# Patient Record
Sex: Female | Born: 1985 | Race: White | Hispanic: No | Marital: Married | Smoking: Never smoker
Health system: Southern US, Community
[De-identification: ages and names within clinical notes are randomized; demographics above are authoritative.]

## PROBLEM LIST (undated history)

## (undated) DIAGNOSIS — A630 Anogenital (venereal) warts: Secondary | ICD-10-CM

## (undated) DIAGNOSIS — D696 Thrombocytopenia, unspecified: Secondary | ICD-10-CM

## (undated) DIAGNOSIS — R87619 Unspecified abnormal cytological findings in specimens from cervix uteri: Secondary | ICD-10-CM

## (undated) DIAGNOSIS — IMO0002 Reserved for concepts with insufficient information to code with codable children: Secondary | ICD-10-CM

## (undated) DIAGNOSIS — O99119 Other diseases of the blood and blood-forming organs and certain disorders involving the immune mechanism complicating pregnancy, unspecified trimester: Secondary | ICD-10-CM

## (undated) DIAGNOSIS — Z8619 Personal history of other infectious and parasitic diseases: Secondary | ICD-10-CM

## (undated) DIAGNOSIS — R87629 Unspecified abnormal cytological findings in specimens from vagina: Secondary | ICD-10-CM

## (undated) DIAGNOSIS — O4190X0 Disorder of amniotic fluid and membranes, unspecified, unspecified trimester, not applicable or unspecified: Secondary | ICD-10-CM

## (undated) HISTORY — DX: Personal history of other infectious and parasitic diseases: Z86.19

## (undated) HISTORY — PX: WISDOM TOOTH EXTRACTION: SHX21

## (undated) HISTORY — DX: Anogenital (venereal) warts: A63.0

## (undated) HISTORY — DX: Unspecified abnormal cytological findings in specimens from vagina: R87.629

## (undated) HISTORY — DX: Disorder of amniotic fluid and membranes, unspecified, unspecified trimester, not applicable or unspecified: O41.90X0

## (undated) HISTORY — DX: Reserved for concepts with insufficient information to code with codable children: IMO0002

## (undated) HISTORY — PX: NO PAST SURGERIES: SHX2092

## (undated) HISTORY — DX: Unspecified abnormal cytological findings in specimens from cervix uteri: R87.619

---

## 2009-04-04 ENCOUNTER — Emergency Department (HOSPITAL_COMMUNITY): Admission: EM | Admit: 2009-04-04 | Discharge: 2009-04-04 | Payer: Self-pay | Admitting: Emergency Medicine

## 2009-04-13 ENCOUNTER — Encounter: Payer: Self-pay | Admitting: Cardiovascular Disease

## 2009-04-20 DIAGNOSIS — R519 Headache, unspecified: Secondary | ICD-10-CM | POA: Insufficient documentation

## 2009-04-20 DIAGNOSIS — IMO0002 Reserved for concepts with insufficient information to code with codable children: Secondary | ICD-10-CM | POA: Insufficient documentation

## 2009-04-20 DIAGNOSIS — R109 Unspecified abdominal pain: Secondary | ICD-10-CM

## 2009-04-20 DIAGNOSIS — R42 Dizziness and giddiness: Secondary | ICD-10-CM

## 2009-04-20 DIAGNOSIS — R55 Syncope and collapse: Secondary | ICD-10-CM

## 2009-04-20 DIAGNOSIS — R51 Headache: Secondary | ICD-10-CM

## 2009-04-20 DIAGNOSIS — B977 Papillomavirus as the cause of diseases classified elsewhere: Secondary | ICD-10-CM

## 2009-04-20 HISTORY — DX: Reserved for concepts with insufficient information to code with codable children: IMO0002

## 2009-04-26 ENCOUNTER — Ambulatory Visit: Payer: Self-pay | Admitting: Cardiovascular Disease

## 2009-05-25 ENCOUNTER — Encounter: Payer: Self-pay | Admitting: Cardiovascular Disease

## 2009-05-25 ENCOUNTER — Ambulatory Visit: Payer: Self-pay

## 2009-05-25 ENCOUNTER — Ambulatory Visit (HOSPITAL_COMMUNITY): Admission: RE | Admit: 2009-05-25 | Discharge: 2009-05-25 | Payer: Self-pay | Admitting: Obstetrics

## 2009-05-25 ENCOUNTER — Ambulatory Visit: Payer: Self-pay | Admitting: Cardiology

## 2009-05-30 ENCOUNTER — Ambulatory Visit: Payer: Self-pay | Admitting: Cardiology

## 2009-07-14 ENCOUNTER — Encounter: Admission: RE | Admit: 2009-07-14 | Discharge: 2009-07-14 | Payer: Self-pay | Admitting: Internal Medicine

## 2010-06-20 NOTE — Assessment & Plan Note (Signed)
Summary: 1 month rov/sl   Visit Type:  1 MO F/U Primary Provider:  Dr. Merla Riches  CC:  no cardiac complaints today.  History of Present Illness: 25 yo WF with no significant past medical seen one month ago  for further evaluation of syncope. She told  me that she has passed out about once per year over last twenty years. Many of these episodes occur after prolonged standing but mostly after blood draws and procedures. Most recently she had a colposcopy  and she felt dizzy afterwards and passed out while laying down. She was reported to have rigidity of her arms but no clear seizure activity. She has had heavy menses in the past and had recent mild anemia. Orthostatics at the initial visit were normal. She described no chest pain or palpitations. We performed an echocardiogram which was normal. She is here today to review the results.  Current Medications (verified): 1)  No Meds At This Time  Allergies: 1)  ! Ceclor  Past History:  Past Medical History: Reviewed history from 04/20/2009 and no changes required. 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 Tobacco Use - No.  No illicit drugs Social alcohol Married, no children Administrative assistant-part time  Review of Systems  The patient denies fatigue, malaise, fever, weight gain/loss, vision loss, decreased hearing, hoarseness, chest pain, palpitations, shortness of breath, prolonged cough, wheezing, sleep apnea, coughing up blood, abdominal pain, blood in stool, nausea, vomiting, diarrhea, heartburn, incontinence, blood in urine, muscle weakness, joint pain, leg swelling, rash, skin lesions, headache, fainting, dizziness, depression, anxiety, enlarged lymph nodes, easy bruising or bleeding, and environmental allergies.    Vital Signs:  Patient profile:   25  year old female Height:      63 inches Weight:      127 pounds BMI:     22.58 Pulse rate:   64 / minute Pulse rhythm:   regular BP sitting:   102 / 70  (left arm) Cuff size:   large  Vitals Entered By: Danielle Rankin, CMA (May 30, 2009 8:55 AM)  Physical Exam  General:  General: Well developed, well nourished, NAD Psychiatric: Mood and affect normal Neck: No JVD Lungs:Clear bilaterally, no wheezes, rhonci, crackles CV: RRR no murmurs, gallops rubs Abdomen: soft, NT, ND, BS present Extremities: No edema, pulses 2+.    Echocardiogram  Procedure date:  05/25/2009  Findings:      - Left ventricle: The cavity size was normal. Wall thickness was       normal. Systolic function was normal. The estimated ejection       fraction was in the range of 60% to 65%. Wall motion was normal;       there were no regional wall motion abnormalities. Left ventricular       diastolic function parameters were normal.     - Aortic valve: There was no stenosis.     - Mitral valve: No significant regurgitation.     - Common pulmonary vein: The Doppler velocity and flow profile were       normal.     - Right ventricle: The cavity size was normal. Systolic function was       normal.     - Pulmonary arteries: No complete TR doppler jet was measured so       unable to estimate PA systolic pressure.     - Systemic  veins: IVC measured 2.2 cm with normal respirophasic       variation. Estimated RA pressure 6-10 mmHg.     Impressions:            - No cause for syncope noted on this study.  Impression & Recommendations:  Problem # 1:  SYNCOPE (ICD-780.2) No clear etiology. Most likely vasovagal. No cardiac abnormalities on echo. No suggestion of arrhythmia. No further workup at this time. She is encouraged to drink adequate fluids and continue with daily exercise.   Patient Instructions: 1)  Your physician recommends that you schedule a follow-up appointment as needed

## 2010-08-23 LAB — POCT I-STAT, CHEM 8
Calcium, Ion: 1.23 mmol/L (ref 1.12–1.32)
Creatinine, Ser: 0.8 mg/dL (ref 0.4–1.2)
Glucose, Bld: 109 mg/dL — ABNORMAL HIGH (ref 70–99)
Potassium: 4.4 mEq/L (ref 3.5–5.1)
Sodium: 136 mEq/L (ref 135–145)

## 2010-08-23 LAB — DIFFERENTIAL
Basophils Absolute: 0 10*3/uL (ref 0.0–0.1)
Basophils Relative: 0 % (ref 0–1)
Eosinophils Absolute: 0 10*3/uL (ref 0.0–0.7)
Neutro Abs: 7.5 10*3/uL (ref 1.7–7.7)
Neutrophils Relative %: 83 % — ABNORMAL HIGH (ref 43–77)

## 2010-08-23 LAB — CBC
Hemoglobin: 11.9 g/dL — ABNORMAL LOW (ref 12.0–15.0)
MCHC: 33.7 g/dL (ref 30.0–36.0)
RBC: 4.16 MIL/uL (ref 3.87–5.11)
WBC: 9 10*3/uL (ref 4.0–10.5)

## 2010-08-23 LAB — URINALYSIS, ROUTINE W REFLEX MICROSCOPIC
Hgb urine dipstick: NEGATIVE
Nitrite: NEGATIVE
Specific Gravity, Urine: 1.015 (ref 1.005–1.030)
pH: 7.5 (ref 5.0–8.0)

## 2010-08-23 LAB — GLUCOSE, CAPILLARY

## 2011-05-22 NOTE — L&D Delivery Note (Signed)
Delivery Note  Small ant lip/+1 at 0145 in tub, spont. bearing down FHR 150 w/ variables to 100. Assisted OOT to bed for lateral pos and continuous EFM  Pushed past ant. lip  In lat pos.  C/C/+2 at 0201 FHR cat II, O2 via FM   At 2:52 AM a viable female was delivered via Vaginal, Spontaneous Delivery (Presentation: Left Occiput Anterior). Spont vigorous cry at birth; APGAR: 9, 9; weight pending.   Placenta status: Intact, Spontaneous.  Cord:  with the following complications: tight CAN x 1, around body x 2, around arm x 1. Unable to reduce, baby delivered head flexed to R leg and body following through; Cord pH: not collected.  Anesthesia: Local  Episiotomy: None Lacerations: 2nd degree;Perineal;Labial Suture Repair: 2.0 3.0 vicryl rapide Est. Blood Loss (mL): 500cc  Uterine atony after placenta, resolved w/ vigorous massage, clots removed from lower uterine segment. Pitocin IM 10 units and IV 20 units. Cytotec 800 mcg per rectum. Will continue Pitocin IV over several hours.  Unasyn 3 gm x 1 prophylaxis for uterine exploration.  Fundus of uterus appears heart-shaped w/ 2 distinctly felt cornua.  Mom to postpartum.  Baby to mother for skin-to-skin, latched on breast in labor room.Marland Kitchen  PAUL,DANIELA 01/04/2012, 4:01 AM

## 2011-05-23 LAB — OB RESULTS CONSOLE ABO/RH

## 2011-05-23 LAB — OB RESULTS CONSOLE ANTIBODY SCREEN: Antibody Screen: NEGATIVE

## 2011-05-23 LAB — OB RESULTS CONSOLE HEPATITIS B SURFACE ANTIGEN: Hepatitis B Surface Ag: NEGATIVE

## 2011-05-23 LAB — OB RESULTS CONSOLE HIV ANTIBODY (ROUTINE TESTING): HIV: NONREACTIVE

## 2011-05-23 LAB — OB RESULTS CONSOLE RUBELLA ANTIBODY, IGM: Rubella: IMMUNE

## 2011-12-04 ENCOUNTER — Ambulatory Visit (INDEPENDENT_AMBULATORY_CARE_PROVIDER_SITE_OTHER): Payer: BC Managed Care – PPO | Admitting: Pediatrics

## 2011-12-04 DIAGNOSIS — Z7681 Expectant parent(s) prebirth pediatrician visit: Secondary | ICD-10-CM

## 2011-12-14 ENCOUNTER — Encounter (HOSPITAL_COMMUNITY): Payer: Self-pay | Admitting: *Deleted

## 2011-12-14 ENCOUNTER — Telehealth (HOSPITAL_COMMUNITY): Payer: Self-pay | Admitting: *Deleted

## 2011-12-14 NOTE — Telephone Encounter (Signed)
Preadmission screen  

## 2011-12-17 ENCOUNTER — Encounter (HOSPITAL_COMMUNITY): Payer: Self-pay | Admitting: *Deleted

## 2011-12-17 ENCOUNTER — Telehealth (HOSPITAL_COMMUNITY): Payer: Self-pay | Admitting: *Deleted

## 2011-12-17 NOTE — Telephone Encounter (Signed)
Preadmission screen  

## 2011-12-25 ENCOUNTER — Inpatient Hospital Stay (HOSPITAL_COMMUNITY): Admission: RE | Admit: 2011-12-25 | Payer: BC Managed Care – PPO | Source: Ambulatory Visit

## 2011-12-27 ENCOUNTER — Telehealth (HOSPITAL_COMMUNITY): Payer: Self-pay | Admitting: *Deleted

## 2011-12-27 NOTE — Telephone Encounter (Signed)
Preadmission screen  

## 2011-12-29 ENCOUNTER — Inpatient Hospital Stay (HOSPITAL_COMMUNITY): Admission: RE | Admit: 2011-12-29 | Payer: BC Managed Care – PPO | Source: Ambulatory Visit

## 2012-01-03 ENCOUNTER — Inpatient Hospital Stay (HOSPITAL_COMMUNITY)
Admission: AD | Admit: 2012-01-03 | Discharge: 2012-01-06 | DRG: 372 | Disposition: A | Discharge status: Home / Self Care | Payer: BC Managed Care – PPO | Source: Ambulatory Visit | Attending: Obstetrics and Gynecology | Admitting: Obstetrics and Gynecology

## 2012-01-03 ENCOUNTER — Encounter (HOSPITAL_COMMUNITY): Payer: Self-pay | Admitting: *Deleted

## 2012-01-03 ENCOUNTER — Inpatient Hospital Stay (HOSPITAL_COMMUNITY)
Admission: RE | Admit: 2012-01-03 | Payer: BC Managed Care – PPO | Source: Ambulatory Visit | Admitting: Obstetrics & Gynecology

## 2012-01-03 DIAGNOSIS — O409XX0 Polyhydramnios, unspecified trimester, not applicable or unspecified: Secondary | ICD-10-CM | POA: Diagnosis present

## 2012-01-03 DIAGNOSIS — D689 Coagulation defect, unspecified: Secondary | ICD-10-CM | POA: Diagnosis present

## 2012-01-03 DIAGNOSIS — D696 Thrombocytopenia, unspecified: Secondary | ICD-10-CM | POA: Diagnosis present

## 2012-01-03 DIAGNOSIS — O480 Post-term pregnancy: Principal | ICD-10-CM | POA: Diagnosis present

## 2012-01-03 HISTORY — DX: Other diseases of the blood and blood-forming organs and certain disorders involving the immune mechanism complicating pregnancy, unspecified trimester: O99.119

## 2012-01-03 HISTORY — DX: Thrombocytopenia, unspecified: D69.6

## 2012-01-03 LAB — CBC
MCH: 30.4 pg (ref 26.0–34.0)
MCHC: 33.4 g/dL (ref 30.0–36.0)
Platelets: 120 10*3/uL — ABNORMAL LOW (ref 150–400)
RDW: 13.5 % (ref 11.5–15.5)

## 2012-01-03 MED ORDER — OXYTOCIN BOLUS FROM INFUSION
250.0000 mL | Freq: Once | INTRAVENOUS | Status: AC
  Administered 2012-01-04: 250 mL via INTRAVENOUS
  Filled 2012-01-03: qty 500

## 2012-01-03 MED ORDER — LACTATED RINGERS IV SOLN: 500.0000 mL | INTRAVENOUS | Status: DC | PRN

## 2012-01-03 MED ORDER — FLEET ENEMA 7-19 GM/118ML RE ENEM: 1.0000 | ENEMA | RECTAL | Status: DC | PRN

## 2012-01-03 MED ORDER — ONDANSETRON HCL 4 MG/2ML IJ SOLN: 4.0000 mg | Freq: Four times a day (QID) | INTRAMUSCULAR | Status: DC | PRN

## 2012-01-03 MED ORDER — IBUPROFEN 600 MG PO TABS
600.0000 mg | ORAL_TABLET | Freq: Four times a day (QID) | ORAL | Status: DC | PRN
  Administered 2012-01-04: 600 mg via ORAL
  Filled 2012-01-03: qty 1

## 2012-01-03 MED ORDER — ACETAMINOPHEN 325 MG PO TABS: 650.0000 mg | ORAL_TABLET | ORAL | Status: DC | PRN

## 2012-01-03 MED ORDER — OXYCODONE-ACETAMINOPHEN 5-325 MG PO TABS
1.0000 | ORAL_TABLET | ORAL | Status: DC | PRN
  Administered 2012-01-04: 1 via ORAL
  Filled 2012-01-03: qty 1

## 2012-01-03 MED ORDER — CITRIC ACID-SODIUM CITRATE 334-500 MG/5ML PO SOLN: 30.0000 mL | ORAL | Status: DC | PRN

## 2012-01-03 MED ORDER — OXYTOCIN 40 UNITS IN LACTATED RINGERS INFUSION - SIMPLE MED
62.5000 mL/h | Freq: Once | INTRAVENOUS | Status: AC
  Administered 2012-01-04: 62.5 mL/h via INTRAVENOUS
  Filled 2012-01-03: qty 1000

## 2012-01-03 MED ORDER — PROMETHAZINE HCL 25 MG/ML IJ SOLN
25.0000 mg | Freq: Four times a day (QID) | INTRAMUSCULAR | Status: DC | PRN
  Administered 2012-01-03: 25 mg via INTRAMUSCULAR
  Filled 2012-01-03: qty 1

## 2012-01-03 MED ORDER — NALBUPHINE SYRINGE 5 MG/0.5 ML
10.0000 mg | INJECTION | INTRAMUSCULAR | Status: DC | PRN
  Administered 2012-01-03: 10 mg via INTRAMUSCULAR
  Filled 2012-01-03 (×2): qty 0.5
  Filled 2012-01-03: qty 1

## 2012-01-03 MED ORDER — LIDOCAINE HCL (PF) 1 % IJ SOLN
30.0000 mL | INTRAMUSCULAR | Status: DC | PRN
  Administered 2012-01-04: 30 mL via SUBCUTANEOUS
  Filled 2012-01-03: qty 30

## 2012-01-03 NOTE — Progress Notes (Signed)
2-31min apart per observation/per pt.

## 2012-01-03 NOTE — Progress Notes (Signed)
Monitors off per Paul,CNM orders.

## 2012-01-03 NOTE — H&P (Signed)
Kathryn Ferguson is a 26 y.o. female presenting for AROM IOL. [facility]NC at [facility] since 8 wks, [place], [place]  Maternal Medical History:  Reason for admission: Reason for Admission:   nauseaAdmit for IOL, advanced dilation and post dates, NST equivocal / BPP 8/8 in office today.   Fetal activity: Perceived fetal activity is normal.   Last perceived fetal movement was within the past hour.    Prenatal complications: Poly diagnosed at 28 wks, AFI max 27cm at 38 wks, decreased to 15.5 today.  Prenatal Complications - Diabetes: none. Failed 1GTT, passed 3GTT with one abnormal value. F/U random fastings WNL.  OB History    Grav Para Term Preterm Abortions TAB SAB Ect Mult Living   1              Past Medical History  Diagnosis Date  . Abnormal Pap smear   . HPV (human papilloma virus) anogenital infection   . Other specified fetal and placental problems affecting management of mother, unspecified as to episode of care     sch  . H/O varicella   . Nonspecific abnormal finding in amniotic fluid     polyhydramnios  . Normal labor 01/03/2012   Past Surgical History  Procedure Date  . No past surgeries    Family History: family history includes Cancer in her paternal grandfather; Heart attack in her maternal aunt; Heart disease in her maternal grandfather and maternal grandmother; Hemophilia in her paternal grandfather; Nephrolithiasis in her maternal grandfather and mother; and Thyroid disease in her mother. Social History:  reports that she has never smoked. She has never used smokeless tobacco. She reports that she does not drink alcohol or use illicit drugs.   Prenatal Transfer Tool  Maternal Diabetes: No Genetic Screening: Declined Maternal Ultrasounds/Referrals: Normal Fetal Ultrasounds or other Referrals:  None Maternal Substance Abuse:  No Significant Maternal Medications:  None Significant Maternal Lab Results:  Lab values include: Group B Strep negative Other Comments:   None  Review of Systems  Gastrointestinal: Negative for heartburn and nausea.  Neurological: Negative for headaches.  All other systems reviewed and are negative.    Dilation: 4 Effacement (%): 80 Station: -1 Exam by:: Daniella Paul,CNM AROM with clear AF, moderate amount, vertex well engaged  Blood pressure 125/81, pulse 76, temperature 98.4 F (36.9 C), temperature source Oral, resp. rate 20, last menstrual period 03/11/2011. Maternal Exam:  Uterine Assessment: Contraction strength is mild.  Contraction frequency is regular.  q8-10 min  Abdomen: Patient reports no abdominal tenderness. Fundal height is 42cm.   Estimated fetal weight is 8'2 at 40 wks.   Fetal presentation: vertex  Introitus: Normal vulva. Normal vagina.  Pelvis: adequate for delivery.   Cervix: Cervix evaluated by digital exam.     Fetal Exam Fetal Monitor Review: Mode: ultrasound.   Baseline rate: 140.  Variability: moderate (6-25 bpm).   Pattern: accelerations present and no decelerations.    Fetal State Assessment: Category I - tracings are normal.     Physical Exam  Constitutional: She is oriented to person, place, and time. She appears well-developed and well-nourished. No distress.  HENT:  Head: Normocephalic.  Neck: Normal range of motion.  Cardiovascular: Normal rate and regular rhythm.   Respiratory: Effort normal and breath sounds normal.  GI: Soft. Bowel sounds are normal.  Genitourinary: Vagina normal.  Musculoskeletal: Normal range of motion. She exhibits no edema.  Neurological: She is alert and oriented to person, place, and time. She has normal reflexes.  Skin: Skin is warm and dry.  Psychiatric: She has a normal mood and affect.    Prenatal labs: ABO, Rh: O/Positive/-- (01/02 0000) Antibody: Negative (01/02 0000) Rubella: Immune (01/02 0000) RPR: Nonreactive (01/02 0000)  HBsAg: Negative (01/02 0000)  HIV: Non-reactive (01/02 0000)  GBS: Negative (07/05 0000)  Genetic  screen declined [place]: normal female anatomy, posterior placenta  Assessment/Plan: IUP at [redacted]w[redacted]d, LGA for AROM IOL Hx poly, normalized Desires water labor/birth, un medicated birth FHT reassuring, Cat I  Admit to BS, routine orders AROM done, plan Pitocin PRN if no active labor next 4-6 hours CNM care, labor support in active labor. Water labor / attempt at water birth cautiously given LGA fetus.  Consult Dr. Alesia Banda 01/03/2012, 4:10 PM

## 2012-01-03 NOTE — Progress Notes (Signed)
S: Requesting exam, ctx getting stronger, difficulty relaxing in between.  O: Filed Vitals:   01/03/12 1435 01/03/12 1552 01/03/12 2006 01/03/12 2137  BP: 124/76 125/81 125/72   Pulse: 75 76 77   Temp:  98.4 F (36.9 C) 99.1 F (37.3 C) 98.3 F (36.8 C)  TempSrc:  Oral Oral Oral  Resp: 20 20 20       FHT:  FHR: 135 bpm, variability: minimal/moderate,  accelerations:  Present,  decelerations:  Absent UC:   irregular, every 1.5-3 minutes SVE:   Dilation: 6 Effacement (%): 100 Station: 0 Exam by:: D. Miciah Covelli CNM   A / P: Spontaneous labor, progressing normally  Fetal Wellbeing:  Category I Pain Control:  Desires pain meds, will give Nubain/Phenergan  Plan hydrotherapy after rest 1-2 hours  Anticipated MOD:  NSVD  Kathryn Ferguson 01/03/2012, 10:05 PM

## 2012-01-03 NOTE — Progress Notes (Signed)
Ysidra Sopher is a 26 y.o. G1P0000 at [redacted]w[redacted]d by ultrasound admitted for induction of labor due to Post dates. Due date 12/24/2011.  Subjective: Feeling ctx for past hour, variable intensity, coping well w/ spouse coaching. On birthing ball then bed in lateral position. Oral hydration. Clear AF seen occasionally.  Objective: Filed Vitals:   01/03/12 1435 01/03/12 1552  BP: 124/76 125/81  Pulse: 75 76  Temp:  98.4 F (36.9 C)  TempSrc:  Oral  Resp: 20 20          FHT:  FHR: 135 bpm, variability: moderate,  accelerations:  Present,  decelerations:  Absent UC:   Palp q 2-4 min, mild/mod SVE:   deferred   Labs:   Basename 01/03/12 1615  WBC 9.0  HGB 11.9*  HCT 35.6*  PLT 120*    Assessment / Plan: Induction of labor due to post dates and advanced dilation,  progressing well on pitocin Mild thrombocytopenia (120 today, was 171 at 28 wks), BP stable  Labor: Progressing normally Preeclampsia:  n/a Fetal Wellbeing:  Category I Pain Control:  Labor support without medications I/D:  n/a Anticipated MOD:  NSVD  Expectant management, continue resting on side lying pos between ctx to conserve energy, position changes encouraged q 30-60 min. Continue encouraging PO fluids, light snacks.    Mirely Pangle 01/03/2012, 7:49 PM

## 2012-01-04 ENCOUNTER — Encounter (HOSPITAL_COMMUNITY): Payer: Self-pay | Admitting: Family Medicine

## 2012-01-04 DIAGNOSIS — O99119 Other diseases of the blood and blood-forming organs and certain disorders involving the immune mechanism complicating pregnancy, unspecified trimester: Secondary | ICD-10-CM

## 2012-01-04 DIAGNOSIS — D696 Thrombocytopenia, unspecified: Secondary | ICD-10-CM | POA: Diagnosis present

## 2012-01-04 HISTORY — DX: Thrombocytopenia, unspecified: D69.6

## 2012-01-04 HISTORY — DX: Thrombocytopenia, unspecified: O99.119

## 2012-01-04 LAB — CBC
HCT: 30.2 % — ABNORMAL LOW (ref 36.0–46.0)
MCH: 30.9 pg (ref 26.0–34.0)
MCV: 89.6 fL (ref 78.0–100.0)
Platelets: 117 10*3/uL — ABNORMAL LOW (ref 150–400)
RDW: 13.4 % (ref 11.5–15.5)

## 2012-01-04 LAB — RPR: RPR Ser Ql: NONREACTIVE

## 2012-01-04 MED ORDER — OXYCODONE-ACETAMINOPHEN 5-325 MG PO TABS: 1.0000 | ORAL_TABLET | ORAL | Status: DC | PRN

## 2012-01-04 MED ORDER — FLEET ENEMA 7-19 GM/118ML RE ENEM: 1.0000 | ENEMA | Freq: Every day | RECTAL | Status: DC | PRN

## 2012-01-04 MED ORDER — BENZOCAINE-MENTHOL 20-0.5 % EX AERO
1.0000 "application " | INHALATION_SPRAY | CUTANEOUS | Status: DC | PRN
  Administered 2012-01-04: 1 via TOPICAL
  Filled 2012-01-04: qty 56

## 2012-01-04 MED ORDER — LANOLIN HYDROUS EX OINT: TOPICAL_OINTMENT | CUTANEOUS | Status: DC | PRN

## 2012-01-04 MED ORDER — PRENATAL MULTIVITAMIN CH
1.0000 | ORAL_TABLET | Freq: Every day | ORAL | Status: DC
  Administered 2012-01-05: 1 via ORAL
  Filled 2012-01-04 (×2): qty 1

## 2012-01-04 MED ORDER — BISACODYL 10 MG RE SUPP: 10.0000 mg | Freq: Every day | RECTAL | Status: DC | PRN

## 2012-01-04 MED ORDER — MISOPROSTOL 200 MCG PO TABS
ORAL_TABLET | ORAL | Status: AC
  Administered 2012-01-04: 800 ug via RECTAL
  Filled 2012-01-04: qty 4

## 2012-01-04 MED ORDER — ZOLPIDEM TARTRATE 5 MG PO TABS: 5.0000 mg | ORAL_TABLET | Freq: Every evening | ORAL | Status: DC | PRN

## 2012-01-04 MED ORDER — SENNOSIDES-DOCUSATE SODIUM 8.6-50 MG PO TABS
2.0000 | ORAL_TABLET | Freq: Every day | ORAL | Status: DC
  Administered 2012-01-04 – 2012-01-05 (×2): 2 via ORAL

## 2012-01-04 MED ORDER — TETANUS-DIPHTH-ACELL PERTUSSIS 5-2.5-18.5 LF-MCG/0.5 IM SUSP: 0.5000 mL | Freq: Once | INTRAMUSCULAR | Status: DC

## 2012-01-04 MED ORDER — ONDANSETRON HCL 4 MG PO TABS: 4.0000 mg | ORAL_TABLET | ORAL | Status: DC | PRN

## 2012-01-04 MED ORDER — SIMETHICONE 80 MG PO CHEW: 80.0000 mg | CHEWABLE_TABLET | ORAL | Status: DC | PRN

## 2012-01-04 MED ORDER — DIBUCAINE 1 % RE OINT
1.0000 "application " | TOPICAL_OINTMENT | RECTAL | Status: DC | PRN
  Administered 2012-01-04: 1 via RECTAL
  Filled 2012-01-04 (×2): qty 28

## 2012-01-04 MED ORDER — OXYTOCIN 10 UNIT/ML IJ SOLN
10.0000 [IU] | Freq: Once | INTRAMUSCULAR | Status: AC | PRN
  Administered 2012-01-04: 10 [IU] via INTRAMUSCULAR
  Filled 2012-01-04: qty 1

## 2012-01-04 MED ORDER — IBUPROFEN 600 MG PO TABS
600.0000 mg | ORAL_TABLET | Freq: Four times a day (QID) | ORAL | Status: DC
  Administered 2012-01-04 – 2012-01-06 (×6): 600 mg via ORAL
  Filled 2012-01-04 (×6): qty 1

## 2012-01-04 MED ORDER — ONDANSETRON HCL 4 MG/2ML IJ SOLN: 4.0000 mg | INTRAMUSCULAR | Status: DC | PRN

## 2012-01-04 MED ORDER — DIPHENHYDRAMINE HCL 25 MG PO CAPS: 25.0000 mg | ORAL_CAPSULE | Freq: Four times a day (QID) | ORAL | Status: DC | PRN

## 2012-01-04 MED ORDER — WITCH HAZEL-GLYCERIN EX PADS
1.0000 "application " | MEDICATED_PAD | CUTANEOUS | Status: DC | PRN
  Administered 2012-01-04: 1 via TOPICAL

## 2012-01-04 MED ORDER — SODIUM CHLORIDE 0.9 % IV SOLN
3.0000 g | Freq: Once | INTRAVENOUS | Status: DC
  Filled 2012-01-04: qty 3

## 2012-01-04 NOTE — Progress Notes (Signed)
S: Doing well, s/p Nubain 10 / Phenergan 25 IM x 1.  Into tub at 1100, water temp 100 F. Relaxing well w/ ctx, now, better able to cope with labor work. OOT to void at 1150 and 1245. PO fluids frequently.  OCeasar Mons Vitals:   01/03/12 2137 01/03/12 2252 01/03/12 2330 01/04/12 0030  BP:  133/79    Pulse:  64    Temp: 98.3 F (36.8 C)  99.3 F (37.4 C) 98.8 F (37.1 C)  TempSrc: Oral  Oral   Resp:  20       FHT intermittent:  FHR: 150 bpm, variability: moderate,  accelerations:  Absent ,  decelerations:  Absent UC:   regular, every 2-3 minutes SVE:   Dilation: 9 Effacement (%): 100 Station: +1 Exam by:: D. Paul CNM Small ant lip, + show, no caput/molding  A / P: Spontaneous labor, progressing normally  Fetal Wellbeing:  Category I Pain Control:  Hydrotherapy / labor support / IM meds  Anticipated MOD:  NSVD Cautious attempt at water birth  PAUL,DANIELA 01/04/2012, 12:44 AM

## 2012-01-05 ENCOUNTER — Inpatient Hospital Stay (HOSPITAL_COMMUNITY): Admission: RE | Admit: 2012-01-05 | Payer: BC Managed Care – PPO | Source: Ambulatory Visit

## 2012-01-05 MED ORDER — HYDROCORTISONE 2.5 % RE CREA
TOPICAL_CREAM | Freq: Three times a day (TID) | RECTAL | Status: DC
  Filled 2012-01-05: qty 28.35

## 2012-01-05 NOTE — Progress Notes (Signed)
Patient ID: Kathryn Ferguson, female   DOB: [birth year], 26 y.o.   MRN: [id number]  PPD 1 SVD  S:  Reports feeling tired -"overall pretty well"              Tolerating po/ No nausea or vomiting             Bleeding is light             Pain controlled withprescription NSAID's including Motrin             Up ad lib with initial dizziness - resolved this am / ambulatory  Newborn breast-feeding  / circ requested - wants to talk with MD prior to procedure   O:  A & O x 3 NAD             VS: Blood pressure 100/66, pulse 85, temperature 98.1 F (36.7 C), temperature source Oral, resp. rate 16, last menstrual period 03/11/2011, SpO2 96.00%, unknown if currently breastfeeding.  LABS: WBC/Hgb/Hct/Plts:  16.6/10.4/30.2/117 (08/16 1039)   Lungs: Clear and unlabored  Heart: regular rate and rhythm   Abdomen: soft, non-tender, non-distended              Fundus: firm, non-tender, U-1  Perineum: mild edema - ice pack in place  Lochia: light-moderate  Extremities: no edema, no calf pain or tenderness    A: PPD # 1              Mild gestational thrombocytopenia - stable platelet  Doing well - stable status  P:  Routine post partum orders  Newborn circ today              Anticpate discharge tomorrow  Marlinda Mike CNM, MSN 01/05/2012, 10:16 AM

## 2012-01-06 MED ORDER — IBUPROFEN 600 MG PO TABS: 600.0000 mg | ORAL_TABLET | Freq: Four times a day (QID) | ORAL | Status: AC

## 2012-01-06 MED ORDER — OXYCODONE-ACETAMINOPHEN 5-325 MG PO TABS: 1.0000 | ORAL_TABLET | ORAL | Status: AC | PRN

## 2012-01-06 MED ORDER — HYDROCORTISONE 2.5 % RE CREA: TOPICAL_CREAM | Freq: Three times a day (TID) | RECTAL | Status: AC

## 2012-01-06 NOTE — Discharge Summary (Signed)
Obstetric Discharge Summary  Reason for Admission: induction of labor - post dates with low AFI Prenatal Procedures: NST and ultrasound Intrapartum Procedures: spontaneous vaginal delivery / water birth Postpartum Procedures: none Complications-Operative and Postpartum: 2nd degree and labial LAC with repair Hemoglobin  Date Value Range Status  01/04/2012 10.4* 12.0 - 15.0 g/dL Final     HCT  Date Value Range Status  01/04/2012 30.2* 36.0 - 46.0 % Final    Physical Exam:  General: alert, cooperative and no distress Lochia: appropriate Uterine Fundus: firm Incision: healing well DVT Evaluation: No evidence of DVT seen on physical exam.  Discharge Diagnoses: Term Pregnancy-delivered  Discharge Information: Date: 01/06/2012 Activity: pelvic rest Diet: routine Medications: PNV, Ibuprofen, Percocet and Anusol-HC Condition: stable Instructions: refer to practice specific booklet Discharge to: home Follow-up Information    Follow up with [facility], CNM. Schedule an appointment as soon as possible for a visit in 6 weeks.   Contact information:   [address] [address] [phone]          Newborn Data: Live born female  Birth Weight: 7 lb 13.4 oz (3555 g) APGAR: 9, 9  Home with mother.  Marlinda Mike 01/06/2012, 10:15 AM

## 2012-01-06 NOTE — Progress Notes (Signed)
PPD 2 SVD  S:  Reports feeling well- really tired - not much sleep with clustering last night             Tolerating po/ No nausea or vomiting             Bleeding is light             Pain controlled with motrin and occasional percocet             Up ad lib / ambulatory  Newborn breast feeding  / Circumcision done   O:  A & O x 3 NAD             VS: Blood pressure 106/61, pulse 78, temperature 98.4 F (36.9 C), temperature source Oral, resp. rate 18, last menstrual period 03/11/2011, SpO2 96.00%, unknown if currently breastfeeding.  Lungs: Clear and unlabored  Heart: regular rate and rhythm / no mumurs  Abdomen: soft, non-tender, non-distended              Fundus: firm, non-tender, U-2  Perineum: no edema  Lochia: light  Extremities: no edema, no calf pain or tenderness    A: PPD # 2   Doing well - stable status  P:  Routine post partum orders  Discharge home             [facility] discharge booklet - instructions reviewed  Marlinda Mike CNM, MSN 01/06/2012, 10:11 AM

## 2012-01-09 NOTE — Discharge Summary (Signed)
Reviewed and agree with note V.Cniyah Sproull, MD 

## 2012-12-18 LAB — OB RESULTS CONSOLE RPR: RPR: NONREACTIVE

## 2012-12-18 LAB — OB RESULTS CONSOLE ANTIBODY SCREEN: ANTIBODY SCREEN: NEGATIVE

## 2012-12-18 LAB — OB RESULTS CONSOLE GC/CHLAMYDIA
CHLAMYDIA, DNA PROBE: NEGATIVE
GC PROBE AMP, GENITAL: NEGATIVE

## 2012-12-18 LAB — OB RESULTS CONSOLE HIV ANTIBODY (ROUTINE TESTING): HIV: NONREACTIVE

## 2012-12-18 LAB — OB RESULTS CONSOLE ABO/RH: RH Type: POSITIVE

## 2012-12-18 LAB — OB RESULTS CONSOLE HEPATITIS B SURFACE ANTIGEN: Hepatitis B Surface Ag: NEGATIVE

## 2013-05-21 NOTE — L&D Delivery Note (Signed)
Delivery Note  First Stage: Labor onset: 1200 Augmentation : none Analgesia /Anesthesia intrapartum: none SROM at 1340  Second Stage: Complete dilation at 1628 Onset of pushing at 1630 FHR second stage 160  Delivery of a viable female at [id number] by CNM in OA position No nuchal cord Cord double clamped after cessation of pulsation, cut by FOB Cord blood sample collected   Third Stage: Placenta delivered via Tomasa BlaseSchultz intact with 3 VC @ 1725 Placenta disposition: L&D storage x 24 hrs Uterine tone firm / bleeding moderate  2nd degree vaginal, perineal laceration identified  Anesthesia for repair: lidocaine Repair 3.0 vicryl rapide, 3.0, 4.0 vicryl Est. Blood Loss (mL): 200  Complications: none  Mom to postpartum.  Baby to Couplet care / Skin to Skin.  Newborn: Birth Weight: 9 lbs 4 oz  Apgar Scores: 9/9 Feeding planned: breastfeeding  Raelyn MoraAWSON, Nithin Demeo, Judie PetitM  MSN, CNM 07/28/2013, 6:13 PM

## 2013-06-23 LAB — OB RESULTS CONSOLE GBS: STREP GROUP B AG: POSITIVE

## 2013-07-28 ENCOUNTER — Encounter (HOSPITAL_COMMUNITY): Payer: Self-pay | Admitting: *Deleted

## 2013-07-28 ENCOUNTER — Telehealth (HOSPITAL_COMMUNITY): Payer: Self-pay | Admitting: *Deleted

## 2013-07-28 ENCOUNTER — Inpatient Hospital Stay (HOSPITAL_COMMUNITY)
Admission: AD | Admit: 2013-07-28 | Discharge: 2013-07-30 | DRG: 775 | Disposition: A | Discharge status: Home / Self Care | Payer: BC Managed Care – PPO | Source: Ambulatory Visit | Attending: Obstetrics & Gynecology | Admitting: Obstetrics & Gynecology

## 2013-07-28 DIAGNOSIS — IMO0001 Reserved for inherently not codable concepts without codable children: Secondary | ICD-10-CM

## 2013-07-28 DIAGNOSIS — O3660X0 Maternal care for excessive fetal growth, unspecified trimester, not applicable or unspecified: Secondary | ICD-10-CM | POA: Diagnosis present

## 2013-07-28 DIAGNOSIS — O9903 Anemia complicating the puerperium: Secondary | ICD-10-CM | POA: Diagnosis not present

## 2013-07-28 DIAGNOSIS — Z22330 Carrier of Group B streptococcus: Secondary | ICD-10-CM

## 2013-07-28 DIAGNOSIS — D62 Acute posthemorrhagic anemia: Secondary | ICD-10-CM | POA: Diagnosis not present

## 2013-07-28 DIAGNOSIS — O99892 Other specified diseases and conditions complicating childbirth: Secondary | ICD-10-CM | POA: Diagnosis present

## 2013-07-28 DIAGNOSIS — O9989 Other specified diseases and conditions complicating pregnancy, childbirth and the puerperium: Secondary | ICD-10-CM

## 2013-07-28 DIAGNOSIS — O480 Post-term pregnancy: Principal | ICD-10-CM | POA: Diagnosis present

## 2013-07-28 LAB — CBC
HCT: 37 % (ref 36.0–46.0)
Hemoglobin: 12.8 g/dL (ref 12.0–15.0)
MCH: 30.8 pg (ref 26.0–34.0)
MCHC: 34.6 g/dL (ref 30.0–36.0)
MCV: 89.2 fL (ref 78.0–100.0)
PLATELETS: 128 10*3/uL — AB (ref 150–400)
RBC: 4.15 MIL/uL (ref 3.87–5.11)
RDW: 13.6 % (ref 11.5–15.5)
WBC: 9.4 10*3/uL (ref 4.0–10.5)

## 2013-07-28 LAB — RPR: RPR Ser Ql: NONREACTIVE

## 2013-07-28 MED ORDER — OXYTOCIN 40 UNITS IN LACTATED RINGERS INFUSION - SIMPLE MED
62.5000 mL/h | INTRAVENOUS | Status: DC
  Filled 2013-07-28: qty 1000

## 2013-07-28 MED ORDER — SODIUM CHLORIDE 0.9 % IV SOLN
2.0000 g | Freq: Once | INTRAVENOUS | Status: AC
  Administered 2013-07-28: 2 g via INTRAVENOUS
  Filled 2013-07-28: qty 2000

## 2013-07-28 MED ORDER — ONDANSETRON HCL 4 MG/2ML IJ SOLN: 4.0000 mg | INTRAMUSCULAR | Status: DC | PRN

## 2013-07-28 MED ORDER — CITRIC ACID-SODIUM CITRATE 334-500 MG/5ML PO SOLN: 30.0000 mL | ORAL | Status: DC | PRN

## 2013-07-28 MED ORDER — SIMETHICONE 80 MG PO CHEW: 80.0000 mg | CHEWABLE_TABLET | ORAL | Status: DC | PRN

## 2013-07-28 MED ORDER — DIPHENHYDRAMINE HCL 25 MG PO CAPS: 25.0000 mg | ORAL_CAPSULE | Freq: Four times a day (QID) | ORAL | Status: DC | PRN

## 2013-07-28 MED ORDER — CALCIUM CARBONATE ANTACID 500 MG PO CHEW
2.0000 | CHEWABLE_TABLET | Freq: Every day | ORAL | Status: DC
  Filled 2013-07-28: qty 1

## 2013-07-28 MED ORDER — IBUPROFEN 600 MG PO TABS
600.0000 mg | ORAL_TABLET | Freq: Four times a day (QID) | ORAL | Status: DC
  Administered 2013-07-29 – 2013-07-30 (×6): 600 mg via ORAL
  Filled 2013-07-28 (×7): qty 1

## 2013-07-28 MED ORDER — IBUPROFEN 600 MG PO TABS
600.0000 mg | ORAL_TABLET | Freq: Four times a day (QID) | ORAL | Status: DC | PRN
  Administered 2013-07-28: 600 mg via ORAL
  Filled 2013-07-28: qty 1

## 2013-07-28 MED ORDER — WITCH HAZEL-GLYCERIN EX PADS: 1.0000 "application " | MEDICATED_PAD | CUTANEOUS | Status: DC | PRN

## 2013-07-28 MED ORDER — BENZOCAINE-MENTHOL 20-0.5 % EX AERO
1.0000 "application " | INHALATION_SPRAY | CUTANEOUS | Status: DC | PRN
  Filled 2013-07-28: qty 56

## 2013-07-28 MED ORDER — OXYTOCIN BOLUS FROM INFUSION
500.0000 mL | INTRAVENOUS | Status: DC
  Administered 2013-07-28: 500 mL via INTRAVENOUS

## 2013-07-28 MED ORDER — ACETAMINOPHEN 325 MG PO TABS: 650.0000 mg | ORAL_TABLET | ORAL | Status: DC | PRN

## 2013-07-28 MED ORDER — LACTATED RINGERS IV SOLN: 500.0000 mL | INTRAVENOUS | Status: DC | PRN

## 2013-07-28 MED ORDER — LACTATED RINGERS IV SOLN
INTRAVENOUS | Status: DC
  Administered 2013-07-28: 14:00:00 via INTRAVENOUS

## 2013-07-28 MED ORDER — OXYCODONE-ACETAMINOPHEN 5-325 MG PO TABS: 1.0000 | ORAL_TABLET | ORAL | Status: DC | PRN

## 2013-07-28 MED ORDER — SENNOSIDES-DOCUSATE SODIUM 8.6-50 MG PO TABS
2.0000 | ORAL_TABLET | ORAL | Status: DC
  Administered 2013-07-29 – 2013-07-30 (×2): 2 via ORAL
  Filled 2013-07-28 (×2): qty 2

## 2013-07-28 MED ORDER — PRENATAL MULTIVITAMIN CH
1.0000 | ORAL_TABLET | Freq: Every day | ORAL | Status: DC
  Administered 2013-07-29 – 2013-07-30 (×2): 1 via ORAL
  Filled 2013-07-28 (×2): qty 1

## 2013-07-28 MED ORDER — TETANUS-DIPHTH-ACELL PERTUSSIS 5-2.5-18.5 LF-MCG/0.5 IM SUSP: 0.5000 mL | Freq: Once | INTRAMUSCULAR | Status: DC

## 2013-07-28 MED ORDER — DIBUCAINE 1 % RE OINT: 1.0000 "application " | TOPICAL_OINTMENT | RECTAL | Status: DC | PRN

## 2013-07-28 MED ORDER — ONDANSETRON HCL 4 MG PO TABS: 4.0000 mg | ORAL_TABLET | ORAL | Status: DC | PRN

## 2013-07-28 MED ORDER — ONDANSETRON HCL 4 MG/2ML IJ SOLN: 4.0000 mg | Freq: Four times a day (QID) | INTRAMUSCULAR | Status: DC | PRN

## 2013-07-28 MED ORDER — LIDOCAINE HCL (PF) 1 % IJ SOLN
30.0000 mL | INTRAMUSCULAR | Status: AC | PRN
  Administered 2013-07-28: 30 mL via SUBCUTANEOUS
  Filled 2013-07-28: qty 30

## 2013-07-28 NOTE — H&P (Signed)
Reviewed and agree with note and plan. V.Tyrica Afzal, MD  

## 2013-07-28 NOTE — H&P (Signed)
OB ADMISSION/ HISTORY & PHYSICAL:  Admission Date: 07/28/2013  1:04 PM  Admit Diagnosis: Active Labor   Kathryn Ferguson is a 28 y.o. female presenting for postterm and contractions every 5 mins since lunchtime.  Prenatal History: G2P1001   EDC : 07/19/2013, by Other Basis  Prenatal care at [facility] since 9.[redacted] weeks gestation Primary Care Provider at [facility]: Marlinda Mike, CNM  Prenatal course complicated by Abnormal glucose tolerance and LGA  Prenatal Labs: ABO, Rh: O (07/31 0000)  Antibody: Negative (07/31 0000) Rubella:  Immune RPR: Nonreactive (07/31 0000)  HBsAg: Negative (07/31 0000)  HIV: Non-reactive (07/31 0000)  GBS: Positive (02/03 0000)  1 hr Glucola : 161 / declined 3 GTT / FBS 76-83   Medical / Surgical History :  Past medical history:  Past Medical History  Diagnosis Date  . Abnormal Pap smear   . HPV (human papilloma virus) anogenital infection   . Other specified fetal and placental problems affecting management of mother, unspecified as to episode of care     sch  . H/O varicella   . Nonspecific abnormal finding in amniotic fluid     polyhydramnios  . Normal labor 01/03/2012  . Postpartum care following vaginal delivery (8/16) 01/04/2012  . Gestational thrombocytopenia 01/04/2012  . Vaginal Pap smear, abnormal      Past surgical history:  Past Surgical History  Procedure Laterality Date  . No past surgeries       Family History:  Family History  Problem Relation Age of Onset  . Thyroid disease Mother   . Nephrolithiasis Mother   . Heart attack Maternal Aunt   . Nephrolithiasis Maternal Grandfather   . Heart disease Maternal Grandfather   . Cancer Paternal Grandfather     brain  . Hemophilia Paternal Grandfather   . Heart disease Maternal Grandmother      Social History:  reports that she has never smoked. She has never used smokeless tobacco. She reports that she does not drink alcohol or use illicit  drugs.   Allergies: Cefaclor    Current Medications at time of admission:  Prescriptions prior to admission  Medication Sig Dispense Refill  . calcium carbonate (TUMS - DOSED IN MG ELEMENTAL CALCIUM) 500 MG chewable tablet Chew 2 tablets by mouth daily.      . Prenatal Vit-Fe Fumarate-FA (PRENATAL MULTIVITAMIN) TABS Take 1 tablet by mouth daily.          Review of Systems: Review of Systems  Constitutional: Negative.   HENT: Negative.   Eyes: Negative.   Respiratory: Negative.   Cardiovascular: Negative.   Gastrointestinal: Negative.   Genitourinary: Negative.   Musculoskeletal: Negative.   Skin: Negative.   Neurological: Negative.   Endo/Heme/Allergies: Negative.   Psychiatric/Behavioral: Negative.    SROM: clear fluid @ 1355   Physical Exam:  Dilation: 5.5 Effacement (%): 100 Station: -2 Exam by:: Sarajane Marek, RNC Filed Vitals:   07/28/13 1400  BP:   Pulse:   Temp:   Resp: 18    General: A&O x 3, NAD Heart: RRR, no murmurs Lungs: CTAB Abdomen: gravid (S>D), soft, non-tender Extremities: atraumatic, full ROM, no edema Genitalia / VE: normal genitalia /VE deferred / see RN's prior exam FHR: 130 moderate variability / accels present / no decels TOCO: regular every 2-5 mins, strong  Labs:    No results found for this basename: WBC, HGB, HCT, PLT,  in the last 72 hours   Assessment:  27 y.o. G2P1001 at [redacted]w[redacted]d  1. Labor: active 2. Fetal Wellbeing: Category 1  3. Pain Control: none / planning water immersion 4. GBS: Positive 5. Ampicillin 2 grams now, then every 6 hrs until delivery  Plan:  1. Admit to BS 2. Routine L&D orders 3. Water Immersion - consent signed and in paper chart    Dr. Juliene PinaMody notified of admission / plan of care    Kenard GowerDAWSON, Celest Reitz, M, MSN, CNM 07/28/2013, 2:08 PM

## 2013-07-28 NOTE — MAU Note (Signed)
UC's started this AM. Was 2cm yesterday in office.

## 2013-07-28 NOTE — Telephone Encounter (Signed)
Preadmission screen  

## 2013-07-28 NOTE — Progress Notes (Addendum)
Patient ID: [name], female   DOB: 04/24/1986, 28 y.o.   MRN: [id number] S: Doing well, pain controlled with natural techniques and water immersion.  (+) pelvic pressure and involuntary pushing with each contraction.   O: Filed Vitals:   07/28/13 1400 07/28/13 1429 07/28/13 1430 07/28/13 1558  BP:  127/71  125/82  Pulse:  75  81  Temp:   97.6 F (36.4 C)   TempSrc:   Oral   Resp: 18  18 20   Height:      Weight:         FHT:  FHR: 130 bpm, variability: moderate,  accelerations:  Present,  decelerations:  Absent UC:   regular, every 2-4 minutes SVE:   Dilation: Lip/rim Effacement (%): 100 Station: +1 Exam by:: Arita Missawson CNM ROP position  A / P: Spontaneous labor, progressing normally  Fetal Wellbeing:  Category I Pain Control:  Labor support without medications / temporary water immersion In water @ 1500 / temp  Out of tub @ 1535 Left Exaggerated Sims Anticipated MOD:  NSVD  *Dr. Juliene PinaMody notified / in room for introduction / in-house Kathryn Ferguson, Kathryn Ferguson, M, MSN, CNM 07/28/2013, 4:15 PM

## 2013-07-29 LAB — CBC
HCT: 29.3 % — ABNORMAL LOW (ref 36.0–46.0)
Hemoglobin: 10 g/dL — ABNORMAL LOW (ref 12.0–15.0)
MCH: 30.5 pg (ref 26.0–34.0)
MCHC: 34.1 g/dL (ref 30.0–36.0)
MCV: 89.3 fL (ref 78.0–100.0)
PLATELETS: 136 10*3/uL — AB (ref 150–400)
RBC: 3.28 MIL/uL — ABNORMAL LOW (ref 3.87–5.11)
RDW: 13.5 % (ref 11.5–15.5)
WBC: 14 10*3/uL — AB (ref 4.0–10.5)

## 2013-07-29 MED ORDER — POLYSACCHARIDE IRON COMPLEX 150 MG PO CAPS
150.0000 mg | ORAL_CAPSULE | Freq: Two times a day (BID) | ORAL | Status: DC
  Administered 2013-07-29 – 2013-07-30 (×3): 150 mg via ORAL
  Filled 2013-07-29 (×5): qty 1

## 2013-07-29 NOTE — Lactation Note (Signed)
This note was copied from the chart of Kathryn Levander CampionMaxine Reasner. Lactation Consultation Note    Follow up consult with this mom of a term baby, now 23 hours post partum. Mom reports she breast fed her first child for 1`6 months, but had nipple pain for the first 4 months. Mom also told me a tight frenulum was looked for, but not seen. Mom is experiencing the same pain with breast feeding this time, and has a stripe on her right breast, and a red, raw nipple tip on left. On exam, the baby can extend her tongue, and it was seen cushioning mom's breast with feeding. I did, however, note that her tongue slims at the tip on extension, and a  frenulum imbedded in thick tissue was noted close to the baby's gum line. This may or may not have anything to do with mom's sore nipples. I did have trouble getting the baby to flange her lips. She was pinching mom's nipples, and was nipple sucking due to a small, tight  Mouth. I  did facial massage and gently pulled down on her chin, and suck training, and then was able to get her to open wide, stay wide and flanged, and mom reported  This latch comfortable. Mom and dad showed how to massage and suck train.  - very receptive to teaching. I will also give mom comfort gels and instruct her in there use. I did try a 20 nipple shield, but this did not help . Mom knows to call for questions/concerns.   Patient Name: Kathryn Ferguson ZOXWR'UToday's Date: 07/29/2013 Reason for consult: Follow-up assessment   Maternal Data    Feeding Feeding Type: Breast Fed Length of feed: 15 min  LATCH Score/Interventions Latch: Repeated attempts needed to sustain latch, nipple held in mouth throughout feeding, stimulation needed to elicit sucking reflex. (baby latached with wide latch after facial massage and suck training - mom reports this latch feeling comfrotable. Mom taught how to massage and suck train) Intervention(s): Adjust position;Assist with latch;Breast compression  Audible Swallowing:  A few with stimulation Intervention(s): Skin to skin;Hand expression  Type of Nipple: Everted at rest and after stimulation  Comfort (Breast/Nipple): Filling, red/small blisters or bruises, mild/mod discomfort  Problem noted: Cracked, bleeding, blisters, bruises Interventions  (Cracked/bleeding/bruising/blister): Expressed breast milk to nipple (24 nipple shiled used - kept baby on nipple, no milk transfer - stopped use of NS)  Hold (Positioning): Assistance needed to correctly position infant at breast and maintain latch. Intervention(s): Breastfeeding basics reviewed;Support Pillows;Position options;Skin to skin  LATCH Score: 6  Lactation Tools Discussed/Used Tools: Nipple Shields Nipple shield size: 24;Other (comment) (shiled did not help with mom's comfort, nor with wider latch, and no milk seen in shield) WIC Program: No   Consult Status Consult Status: Follow-up Date: 07/30/13 Follow-up type: In-patient    Alfred LevinsLee, Elouise Divelbiss Anne 07/29/2013, 5:21 PM

## 2013-07-29 NOTE — Progress Notes (Signed)
PPD #1- SVD  Subjective:   Reports feeling well, cramping with breastfeeding Tolerating po/ No nausea or vomiting Bleeding is light Pain controlled with Motrin Up ad lib / ambulatory / voiding without problems Newborn: breastfeeding     Objective:   VS:  VS:  Filed Vitals:   07/28/13 2010 07/28/13 2110 07/29/13 0120 07/29/13 0619  BP: 104/71 120/72 103/66 100/69  Pulse: 89 84 66 79  Temp: 98.9 F (37.2 C) 98.5 F (36.9 C) 98.8 F (37.1 C) 97.9 F (36.6 C)  TempSrc:    Oral  Resp: 18 18 18 20   Height:      Weight:        LABS:  Recent Labs  07/28/13 1415 07/29/13 0601  WBC 9.4 14.0*  HGB 12.8 10.0*  PLT 128* 136*   Blood type: O/Positive/-- (07/31 0000) Rubella:     I&O: Intake/Output     03/10 0701 - 03/11 0700 03/11 0701 - 03/12 0700   Blood 200    Total Output 200     Net -200            Physical Exam: Alert and oriented x3 Abdomen: soft, non-tender, non-distended  Fundus: firm, non-tender, U-2 Perineum: Well approximated, no significant erythema, edema, or drainage; healing well. Lochia: small Extremities: no edema, no calf pain or tenderness    Assessment:  PPD # 1G2P2002/ S/P:spontaneous vaginal, 2nd degree laceration  ABL anemia Doing well    Plan: Continue routine post partum orders Start Niferex Anticipate D/C home tomorrow   Donette LarryBHAMBRI, Trishelle Devora, N MSN, CNM 07/29/2013, 10:01 AM

## 2013-07-29 NOTE — Lactation Note (Signed)
This note was copied from the chart of Kathryn Levander CampionMaxine Bevins. Lactation Consultation Note Initial consult:  Baby Kathryn 616 hours old. Hand expression reviewed, mother has good spray of colostrum.  Mother undressed baby and encouraged mother to try feeding baby in football position for a deeper latch. Active sucking and swallows observed. LS 9.  Reviewed basics, Baby & Me pp 20-24, channel 11, feeding cues, hand expression, engorgement care, LCOP/Brochure. Encouraged mother to call for assistance if needed.  Patient Name: Kathryn Ferguson BJYNW'GToday's Date: 07/29/2013     Maternal Data    Feeding Feeding Type: Breast Fed  LATCH Score/Interventions Latch: Grasps breast easily, tongue down, lips flanged, rhythmical sucking.  Audible Swallowing: Spontaneous and intermittent  Type of Nipple: Everted at rest and after stimulation  Comfort (Breast/Nipple): Soft / non-tender     Hold (Positioning): No assistance needed to correctly position infant at breast.  LATCH Score: 10  Lactation Tools Discussed/Used     Consult Status      Dahlia ByesBerkelhammer, [facility] 07/29/2013, 9:54 AM

## 2013-07-30 ENCOUNTER — Inpatient Hospital Stay (HOSPITAL_COMMUNITY): Admission: RE | Admit: 2013-07-30 | Payer: BC Managed Care – PPO | Source: Ambulatory Visit

## 2013-07-30 MED ORDER — POLYSACCHARIDE IRON COMPLEX 150 MG PO CAPS: 150.0000 mg | ORAL_CAPSULE | Freq: Two times a day (BID) | ORAL | Status: DC

## 2013-07-30 MED ORDER — IBUPROFEN 600 MG PO TABS: 600.0000 mg | ORAL_TABLET | Freq: Four times a day (QID) | ORAL | Status: DC

## 2013-07-30 NOTE — Discharge Summary (Signed)
Obstetric Discharge Summary Reason for Admission: onset of labor and rupture of membranes Prenatal Procedures: NST and ultrasound Intrapartum Procedures: spontaneous vaginal delivery Postpartum Procedures: none Complications-Operative and Postpartum: 2nd degree perineal laceration Hemoglobin  Date Value Ref Range Status  07/29/2013 10.0* 12.0 - 15.0 g/dL Final     DELTA CHECK NOTED     REPEATED TO VERIFY     HCT  Date Value Ref Range Status  07/29/2013 29.3* 36.0 - 46.0 % Final    Physical Exam:  General: alert, cooperative and no distress Lochia: appropriate Uterine Fundus: firm, midline, @U  Perineum: 2nd degree repair healing well, no edema DVT Evaluation: No evidence of DVT seen on physical exam. Negative Homan's sign. No cords or calf tenderness. No significant calf/ankle edema.  Discharge Diagnoses: Post-date pregnancy, delivered  Discharge Information: Date: 07/30/2013 Activity: pelvic rest Diet: routine Medications: PNV, Ibuprofen and Iron Condition: stable Instructions: refer to practice specific booklet Discharge to: home Follow-up Information   Follow up with Marlinda MikeBAILEY, TANYA, CNM. Schedule an appointment as soon as possible for a visit in 6 weeks. (Call the office, As needed)    Specialty:  Obstetrics and Gynecology   Contact information:   [address] [place] [id number] [phone]       Newborn Data: Live born female on 07/28/2013 Birth Weight: 9 lb 4.5 oz (4210 g) APGAR: 9, 9  Home with mother.  Kenard GowerAWSON, Luna Audia, M, MSN, CNM 07/30/2013, 8:38 AM

## 2013-07-30 NOTE — Progress Notes (Signed)
Patient ID: Kathryn Ferguson, female   DOB: 10/04/1985, 28 y.o.   MRN: [id number] Post Partum Day #2            Information for the patient's newborn:  Kathryn Ferguson, Kathryn Ferguson [[id number]][[id number]]  female  Feeding: breast  Subjective: No HA, SOB, CP, F/C, breast symptoms. Pain well-controlled with ibuprofen. Normal vaginal bleeding, no clots.      Objective:  Temp:  [98.2 F (36.8 C)] 98.2 F (36.8 C) (03/11 1656) Pulse Rate:  [69-72] 69 (03/11 1656) Resp:  [18-19] 19 (03/11 1656) BP: (101-110)/(67-79) 110/67 mmHg (03/11 1656)    Recent Labs  07/28/13 1415 07/29/13 0601  WBC 9.4 14.0*  HGB 12.8 10.0*  HCT 37.0 29.3*  PLT 128* 136*    Blood type: O/Positive/-- (07/31 0000) Rubella:      Physical Exam:  General: alert, cooperative and no distress Uterine Fundus: firm, midline, @ U Lochia: appropriate Perineum: 2nd degree repair healing well, edema none DVT Evaluation: No evidence of DVT seen on physical exam. Negative Homan's sign. No cords or calf tenderness. No significant calf/ankle edema.    Assessment/Plan: PPD # 2 / 28 y.o., [id number] S/P: spontaneous vaginal   Postpartum care following vaginal delivery (3/10)    Normal postpartum exam  Continue current postpartum care  D/C home   LOS: 2 days   Raelyn MoraAWSON, Tionne Dayhoff, M, MSN, CNM 07/30/2013, 8:33 AM

## 2013-07-30 NOTE — Lactation Note (Addendum)
This note was copied from the chart of Girl Levander CampionMaxine Havens. Lactation Consultation Note  Patient Name: Girl Levander CampionMaxine Ogata NWGNF'AToday's Date: 07/30/2013 Reason for consult: Follow-up assessment Per mom breast are feeling fuller , nipples are tender and baby is presently latched. LC assessed latch, multiply swallows noted, increased with breast compressions. After 10 mins baby released and noticed the base of the nipple to be misshaped. Right nipple has a small  Intact blister and the left has a positional strip.  Possible due to lack of pillow support. LC reviewed basics of latching and the importance \ Of achieving depth. LC noticed baby to have a recessed chin, small mouth, and small dimple in the  Tip of her tongue, Great tongue  mobility noted and baby able to stretch tongue over gum line. Reviewed sore nipple and engorgement prevention and tx . Mom using comfort gles and LC instructed  on hand pump and shells. Mom aware of the [facility] and [facility].  No nipple shield was used for this latch and the baby latched well.  Maternal Data Has patient been taught Hand Expression?: Yes (reviewed , steady of colostrum noted )  Feeding Feeding Type:  (baby already latched with swallows ) Length of feed: 10 min (per mom and dad , LC noted a consistent pattern,swallows )  LATCH Score/Interventions Latch:  (latched depth )  Audible Swallowing:  (multiply swallows )  Type of Nipple:  (nipple misshaped at the base when baby released )  Comfort (Breast/Nipple):  (per mom comfortable with feeding )  Problem noted:  (breast filling , heavier per mom )  Hold (Positioning):  (mom independent with latch ) Intervention(s): Breastfeeding basics reviewed (enc skin to skin with feeding )     Lactation Tools Discussed/Used Tools: Pump;Comfort gels;Shells (no Nipple shield used with latch ) Shell Type: Inverted Breast pump type: Manual Pump Review: Setup, frequency, and cleaning Initiated by:: MAI   Date initiated:: 07/30/13   Consult Status Consult Status: Complete    Kathrin Greathouseorio, Lind Ausley Ann 07/30/2013, 10:59 AM

## 2013-07-30 NOTE — Discharge Instructions (Signed)
Breast Pumping Tips °Pumping your breast milk is a good way to stimulate milk production and have a steady supply of breast milk for your infant. Pumping is most helpful during your infant's growth spurts, when involving dad or a family member, or when you are away. There are several types of pumps available. They can be purchased at a baby or maternity store. You can begin pumping soon after delivery, but some experts believe that you should wait about four weeks to give your infant a bottle. °In general, the more you breastfeed or pump, the more milk you will have for your infant. It is also important to take good care of yourself. This will reduce stress and help your body to create a healthy supply of milk. Your caregiver or lactation consultant can give you the information and support you need in your efforts to breastfeed your infant. °PUMPING BREAST MILK  °Follow the tips below for successful breast pumping. °Take care of yourself. °· Drink enough water or fluids to keep urine clear or pale yellow. You may notice a thirsty feeling while breastfeeding. This is because your body needs more water to make breast milk. Keep a large water bottle handy. Make healthy drink choices such as unsweetened fruit juice, milk and water. Limit soda, coffee, and alcohol (wait 2 hours to feed or pump if you have an alcoholic drink.) °· Eat a healthy, well-balanced diet rich in fruits, vegetables, and whole grains. °· Exercise as recommended by your caregiver. °· Get plenty of sleep. Sleep when your infant sleeps. Ask friends and family for help if you need time to nap or rest. °· Do not smoke. Smoking can lower your milk supply and harm your infant. If you need help quitting, ask your caregiver for a program recommendation. °· Ask your caregiver about birth control options. Birth control pills may lower your milk supply. You may be advised to use condoms or other forms of birth control. °Relax and pump °Stimulating your  let-down reflex is the key to successful and effective pumping. This makes the milk in all parts of the breast flow more freely.  °· It is easier to pump breast milk (and breastfeed) while you are relaxed. Find techniques that work for you. Quiet private spaces, breast massage, soothing heat placed on the breast, music, and pictures or a tape recording of your infant may help you to relax and "let down" your milk. If you have difficulty with your let down, try smelling one of your infant's blankets or an item of clothing he or she has worn while you are pumping. °· When pumping, place the special suction cup (flange) directly over the nipple. It may be uncomfortable and cause nipple damage if it is not placed properly or is the wrong size. Applying a small amount of purified or modified lanolin to your nipple and the areola may help increase your comfort level. Also, you can change the speed and suction of many electric pumps to your comfort level. Your caregiver or lactation consultant can help you with this. °· If pumping continues to be painful, or you feel you are not getting very much milk when you pump, you may need a different type of pump. A lactation consultant can help you determine if this is the case. °· If you are with your infant, feed him or her on demand and try pumping after each feeding. This will boost your production, even if milk does not come out. You may not be able   to pump much milk at first, but keep up the routine, and this will change. °· If you are working or away from your infant for several hours, try pumping for about 15 minutes every 2 to 3 hours. Pump both breasts at the same time if you can. °· If your infant has a formula feeding, make sure you pump your milk around the same time to maintain your supply. °· Begin pumping breast milk a few weeks before you return to work. This will help you develop techniques that work for you and will be able to store extra milk. °· Find a source  of breastfeeding information that works well for you. °TIPS FOR STORING BREAST MILK °· Store breast milk in a sealable sterile bag, jar, or container provided with your pumping supplies. °· Store milk in small amounts close to what your infant is drinking at each feeding. °· Cool pumped milk in a refrigerator or cooler. Pumped milk can last at the back of the refrigerator for 3 to 8 days. °· Place cooled milk at the back of the freezer for up to 3 months. °· Thaw the milk in its container or bag in warm water up to 24 hours in advance. Do not use a microwave to thaw or heat milk. Do not refreeze the milk after it has been thawed. °· Breast milk is safe to drink when left at room temperature (mid 70s or colder) for 4 to 8 hours. After that, throw it away. °· Milk fat can separate and look funny. The color can vary slightly from day to day. This is normal. Always shake the milk before using it to mix the fat with the more watery portion. °SEEK MEDICAL CARE IF:  °· You are having trouble pumping or feeding your infant. °· You are concerned that you are not making enough milk. °· You have nipple pain, soreness, or redness. °· You have other questions or concerns related to you or your infant. °Document Released: 10/25/2009 Document Revised: 07/30/2011 Document Reviewed: 10/25/2009 °ExitCare® Patient Information ©2014 [employer]. ° °Nutrition for the New Mother  °A new mother needs good health and nutrition so she can have energy to take care of a new baby. Whether a mother breastfeeds or formula feeds the baby, it is important to have a well-balanced diet. Foods from all the food groups should be chosen to meet the new mother's energy needs and to give her the nutrients needed for repair and healing.  °A HEALTHY EATING PLAN °The My [employer]ram[employer] plan for Moms outlines what you should eat to help you and your baby stay healthy. The energy and amount of food you need depends on whether or not you are breastfeeding. If you  are breastfeeding you will need more nutrients. If you choose not to breastfeed, your nutrition goal should be to return to a healthy weight. Limiting calories may be needed if you are not breastfeeding.  °HOME CARE INSTRUCTIONS  °· For a personal plan based on your unique needs, see your Registered Dietitian or visit www.mypyramid.gov. °· Eat a variety of foods. The plan below will help guide you. The following chart has a suggested daily meal plan from the [employer] for Moms. °· Eat a variety of fruits and vegetables. °· Eat more dark green and orange vegetables and cooked dried beans. °· Make half your grains whole grains. Choose whole instead of refined grains. °· Choose low-fat or lean meats and poultry. °· Choose low-fat or fat-free dairy products   like milk, cheese, or yogurt. °Fruits °· Breastfeeding: 2 cups °· Non-Breastfeeding: 2 cups °· What Counts as a serving? °· 1 cup of fruit or juice. °· ½ cup dried fruit. °Vegetables °· Breastfeeding: 3 cups °· Non-Breastfeeding: 2 ½ cups °· What Counts as a serving? °· 1 cup raw or cooked vegetables. °· Juice or 2 cups raw leafy vegetables. °Grains °· Breastfeeding: 8 oz °· Non-Breastfeeding: 6 oz °· What Counts as a serving? °· 1 slice bread. °· 1 oz ready-to-eat cereal. °· ½ cup cooked pasta, rice, or cereal. °Meat and Beans °· Breastfeeding: 6 ½ oz °· Non-Breastfeeding: 5 ½ oz °· What Counts as a serving? °· 1 oz lean meat, poultry, or fish °· ¼ cup cooked dry beans °· ½ oz nuts or 1 egg °· 1 tbs peanut butter °Milk °· Breastfeeding: 3 cups °· Non-Breastfeeding: 3 cups °· What Counts as a serving? °· 1 cup milk. °· 8 oz yogurt. °· 1 ½ oz cheese. °· 2 oz processed cheese. °TIPS FOR THE BREASTFEEDING MOM °· Rapid weight loss is not suggested when you are breastfeeding. By simply breastfeeding, you will be able to lose the weight gained during your pregnancy. Your caregiver can keep track of your weight and tell you if your weight loss is appropriate. °· Be sure to  drink fluids. You may notice that you are thirstier than usual. A suggestion is to drink a glass of water or other beverage whenever you breastfeed. °· Avoid alcohol as it can be passed into your breast milk. °· Limit caffeine drinks to no more than 2 to 3 cups per day. °· You may need to keep taking your prenatal vitamin while you are breastfeeding. Talk with your caregiver about taking a vitamin or supplement. °RETURING TO A HEALTHY WEIGHT °· The My [employer] Plan for Moms will help you return to a healthy weight. It will also provide the nutrients you need. °· You may need to limit "empty" calories. These include: °· High fat foods like fried foods, fatty meats, fast food, butter, and mayonnaise. °· High sugar foods like sodas, jelly, candy, and sweets. °· Be physically active. Include 30 minutes of exercise or more each day. Choose an activity you like such as walking, swimming, biking, or aerobics. Check with your caregiver before you start to exercise. °Document Released: 08/14/2007 Document Revised: 07/30/2011 Document Reviewed: 08/14/2007 °Exit[employer]® Patient Information ©2014 [employer]. °Breastfeeding and Mastitis °Mastitis is inflammation of the breast tissue. It can occur in women who are breastfeeding. This can make breastfeeding painful. Mastitis will sometimes go away on its own. Your health care provider will help determine if treatment is needed. °CAUSES °Mastitis is often associated with a blocked milk (lactiferous) duct. This can happen when too much milk builds up in the breast. Causes of excess milk in the breast can include: °· Poor latch-on. If your baby is not latched onto the breast properly, she or he may not empty your breast completely while breastfeeding. °· Allowing too much time to pass between feedings. °· Wearing a bra or other clothing that is too tight. This puts extra pressure on the lactiferous ducts so milk does not flow through them as it should. °Mastitis can also be caused by  a bacterial infection. Bacteria may enter the breast tissue through cuts or openings in the skin. In women who are breastfeeding, this may occur because of cracked or irritated skin. Cracks in the skin are often caused when your baby does not latch on   properly to the breast. °SIGNS AND SYMPTOMS °· Swelling, redness, tenderness, and pain in an area of the breast. °· Swelling of the glands under the arm on the same side. °· Fever may or may not accompany mastitis. °If an infection is allowed to progress, a collection of pus (abscess) may develop. °DIAGNOSIS  °Your health care provider can usually diagnose mastitis based on your symptoms and a physical exam. Tests may be done to help confirm the diagnosis. These may include: °· Removal of pus from the breast by applying pressure to the area. This pus can be examined in the lab to determine which bacteria are present. If an abscess has developed, the fluid in the abscess can be removed with a needle. This can also be used to confirm the diagnosis and determine the bacteria present. In most cases, pus will not be present. °· Blood tests to determine if your body is fighting a bacterial infection. °· Mammogram or ultrasound tests to rule out other problems or diseases. °TREATMENT  °Mastitis that occurs with breastfeeding will sometimes go away on its own. Your health care provider may choose to wait 24 hours after first seeing you to decide whether a prescription medicine is needed. If your symptoms are worse after 24 hours, your health care provider will likely prescribe an antibiotic to treat the mastitis. He or she will determine which bacteria are most likely causing the infection and will then select an appropriate antibiotic. This is sometimes changed based on the results of tests performed to identify the bacteria, or if there is no response to the antibiotic selected. Antibiotics are usually given by mouth. You may also be given medicine for pain. °HOME CARE  INSTRUCTIONS °· Only take over-the-counter or prescription medicines for pain, fever, or discomfort as directed by your health care provider. °· If your health care provider prescribed an antibiotic, take the medicine as directed. Make sure you finish it even if you start to feel better. °· Do not wear a tight or underwire bra. Wear a soft, supportive bra. °· Increase your fluid intake, especially if you have a fever. °· Continue to empty the breast. Your health care provider can tell you whether this milk is safe for your infant or needs to be thrown out. You may be told to stop nursing until your health care provider thinks it is safe for your baby. Use a breast pump if you are advised to stop nursing. °· Keep your nipples clean and dry. °· Empty the first breast completely before going to the other breast. If your baby is not emptying your breasts completely for some reason, use a breast pump to empty your breasts. °· If you go back to work, pump your breasts while at work to stay in time with your nursing schedule. °· Avoid allowing your breasts to become overly filled with milk (engorged). °SEEK MEDICAL CARE IF: °· You have pus-like discharge from the breast. °· Your symptoms do not improve with the treatment prescribed by your health care provider within 2 days. °SEEK IMMEDIATE MEDICAL CARE IF: °· Your pain and swelling are getting worse. °· You have pain that is not controlled with medicine. °· You have a red line extending from the breast toward your armpit. °· You have a fever or persistent symptoms for more than 2 3 days. °· You have a fever and your symptoms suddenly get worse. °MAKE SURE YOU:  °· Understand these instructions. °· Will watch your condition. °· Will get help   right away if you are not doing well or get worse. °Document Released: 09/01/2004 Document Revised: 01/07/2013 Document Reviewed: 12/11/2012 °Exit[employer]® Patient Information ©2014 [employer]. °Postpartum Depression and Baby Blues °The  postpartum period begins right after the birth of a baby. During this time, there is often a great amount of joy and excitement. It is also a time of considerable changes in the life of the parent(s). Regardless of how many times a mother gives birth, each child brings new challenges and dynamics to the family. It is not unusual to have feelings of excitement accompanied by confusing shifts in moods, emotions, and thoughts. All mothers are at risk of developing postpartum depression or the "baby blues." These mood changes can occur right after giving birth, or they may occur many months after giving birth. The baby blues or postpartum depression can be mild or severe. Additionally, postpartum depression can resolve rather quickly, or it can be a long-term condition. °CAUSES °Elevated hormones and their rapid decline are thought to be a main cause of postpartum depression and the baby blues. There are a number of hormones that radically change during and after pregnancy. Estrogen and progesterone usually decrease immediately after delivering your baby. The level of thyroid hormone and various cortisol steroids also rapidly drop. Other factors that play a major role in these changes include major life events and genetics.  °RISK FACTORS °If you have any of the following risks for the baby blues or postpartum depression, know what symptoms to watch out for during the postpartum period. Risk factors that may increase the likelihood of getting the baby blues or postpartum depression include: °· Having a personal or family history of depression. °· Having depression while being pregnant. °· Having premenstrual or oral contraceptive-associated mood issues. °· Having exceptional life stress. °· Having marital conflict. °· Lacking a social support network. °· Having a baby with special needs. °· Having health problems such as diabetes. °SYMPTOMS °Baby blues symptoms include: °· Brief fluctuations in mood, such as going from  extreme happiness to sadness. °· Decreased concentration. °· Difficulty sleeping. °· Crying spells, tearfulness. °· Irritability. °· Anxiety. °Postpartum depression symptoms typically begin within the first month after giving birth. These symptoms include: °· Difficulty sleeping or excessive sleepiness. °· Marked weight loss. °· Agitation. °· Feelings of worthlessness. °· Lack of interest in activity or food. °Postpartum psychosis is a very concerning condition and can be dangerous. Fortunately, it is rare. Displaying any of the following symptoms is cause for immediate medical attention. Postpartum psychosis symptoms include: °· Hallucinations and delusions. °· Bizarre or disorganized behavior. °· Confusion or disorientation. °DIAGNOSIS  °A diagnosis is made by an evaluation of your symptoms. There are no medical or lab tests that lead to a diagnosis, but there are various questionnaires that a caregiver may use to identify those with the baby blues, postpartum depression, or psychosis. Often times, a screening tool called the Edinburgh Postnatal Depression Scale is used to diagnose depression in the postpartum period.  °TREATMENT °The baby blues usually goes away on its own in 1 to 2 weeks. Social support is often all that is needed. You should be encouraged to get adequate sleep and rest. Occasionally, you may be given medicines to help you sleep.  °Postpartum depression requires treatment as it can last several months or longer if it is not treated. Treatment may include individual or group therapy, medicine, or both to address any social, physiological, and psychological factors that may play a role in the depression.   Regular exercise, a healthy diet, rest, and social support may also be strongly recommended.  °Postpartum psychosis is more serious and needs treatment right away. Hospitalization is often needed. °HOME CARE INSTRUCTIONS °· Get as much rest as you can. Nap when the baby sleeps. °· Exercise  regularly. Some women find yoga and walking to be beneficial. °· Eat a balanced and nourishing diet. °· Do little things that you enjoy. Have a cup of tea, take a bubble bath, read your favorite magazine, or listen to your favorite music. °· Avoid alcohol. °· Ask for help with household chores, cooking, grocery shopping, or running errands as needed. Do not try to do everything. °· Talk to people close to you about how you are feeling. Get support from your partner, family members, friends, or other new moms. °· Try to stay positive in how you think. Think about the things you are grateful for. °· Do not spend a lot of time alone. °· Only take medicine as directed by your caregiver. °· Keep all your postpartum appointments. °· Let your caregiver know if you have any concerns. °SEEK MEDICAL CARE IF: °You are having a reaction or problems with your medicine. °SEEK IMMEDIATE MEDICAL CARE IF: °· You have suicidal feelings. °· You feel you may harm the baby or someone else. °Document Released: 02/09/2004 Document Revised: 07/30/2011 Document Reviewed: 02/16/2013 °[employer]® Patient Information ©2014 [employer]. ° °

## 2013-07-31 NOTE — Discharge Summary (Signed)
Reviewed and agree with note and plan. V.Dorien Mayotte, MD  

## 2014-03-22 ENCOUNTER — Encounter (HOSPITAL_COMMUNITY): Payer: Self-pay | Admitting: *Deleted

## 2014-04-21 ENCOUNTER — Ambulatory Visit (INDEPENDENT_AMBULATORY_CARE_PROVIDER_SITE_OTHER): Payer: BC Managed Care – PPO | Admitting: Physician Assistant

## 2014-04-21 VITALS — BP 96/60 | HR 94 | Temp 98.2°F | Resp 16 | Ht 63.0 in | Wt 121.4 lb

## 2014-04-21 DIAGNOSIS — R519 Headache, unspecified: Secondary | ICD-10-CM

## 2014-04-21 DIAGNOSIS — R51 Headache: Secondary | ICD-10-CM

## 2014-04-21 DIAGNOSIS — R112 Nausea with vomiting, unspecified: Secondary | ICD-10-CM

## 2014-04-21 LAB — POCT CBC
Granulocyte percent: 75.6 %G (ref 37–80)
HEMATOCRIT: 42.2 % (ref 37.7–47.9)
HEMOGLOBIN: 13.9 g/dL (ref 12.2–16.2)
LYMPH, POC: 1 (ref 0.6–3.4)
MCH: 29.2 pg (ref 27–31.2)
MCHC: 33 g/dL (ref 31.8–35.4)
MCV: 88.5 fL (ref 80–97)
MID (cbc): 0.4 (ref 0–0.9)
MPV: 6.3 fL (ref 0–99.8)
POC Granulocyte: 4.2 (ref 2–6.9)
POC LYMPH %: 17.9 % (ref 10–50)
POC MID %: 6.5 %M (ref 0–12)
Platelet Count, POC: 175 10*3/uL (ref 142–424)
RBC: 4.76 M/uL (ref 4.04–5.48)
RDW, POC: 12.8 %
WBC: 5.5 10*3/uL (ref 4.6–10.2)

## 2014-04-21 LAB — COMPREHENSIVE METABOLIC PANEL
ALT: 15 U/L (ref 0–35)
AST: 30 U/L (ref 0–37)
Albumin: 4.2 g/dL (ref 3.5–5.2)
Alkaline Phosphatase: 67 U/L (ref 39–117)
BILIRUBIN TOTAL: 0.5 mg/dL (ref 0.2–1.2)
BUN: 14 mg/dL (ref 6–23)
CO2: 27 meq/L (ref 19–32)
CREATININE: 0.71 mg/dL (ref 0.50–1.10)
Calcium: 9.1 mg/dL (ref 8.4–10.5)
Chloride: 100 mEq/L (ref 96–112)
GLUCOSE: 83 mg/dL (ref 70–99)
Potassium: 3.9 mEq/L (ref 3.5–5.3)
Sodium: 135 mEq/L (ref 135–145)
TOTAL PROTEIN: 6.9 g/dL (ref 6.0–8.3)

## 2014-04-21 NOTE — Progress Notes (Signed)
Subjective:    Patient ID: Kathryn Ferguson, female    DOB: 12/29/1985, 28 y.o.   MRN: [id number]   PCP: No PCP Per Patient  Chief Complaint  Patient presents with  . Headache    x3 days; was seen an another urgernt care and was given Toradol  . Nausea  . Dizziness  . Emesis    off/on    Allergies  Allergen Reactions  . Cefaclor Swelling    Joint swelling when 28 yrs old Has tolerated amoxicillin    Patient Active Problem List   Diagnosis Date Noted  . 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 SYNCOPE 04/20/2009  . Headache 04/20/2009    Prior to Admission medications   Medication Sig Start Date End Date Taking? Authorizing Provider  Prenatal Vit-Fe Fumarate-FA (PRENATAL MULTIVITAMIN) TABS Take 1 tablet by mouth daily.   Yes Historical Provider, MD    Medical, Surgical, Family and Social History reviewed and updated.  HPI  This 28 y.o. female presents for evaluation of headache and nausea. Sunday (04/18/2014) night awoke to nurse her daughter about 12 and felt chilled, mild nausea. The time she got up to feed, she felt hot and still nauseated. Monday felt nausea, dizziness, fatigued. "I pretty much stayed in bed." Unable to keep much down, yesterday was able to keep down "bland stuff" but this morning vomited the smoothie she tried. Since then she's been able to keep down small amounts of bland food like chicken. Went to an [facility] last night. Flu test and urine HCG were negative. Was advised that she likely has a viral syndrome. She was given a toradol injection helped x 30 minutes, then symptoms returned. Has not yet filled Rx for Zofran. She isn't sure it's safe while breastfeeding.  HA persists. Feels tense in the upper back/neck. Acetaminophen x 3 doses, no benefit. Has not yet restarted menses since the birth of her daughter. She wants to make sure its not meningitis or encephalitis.  FROM of neck. No photophobia or phonophobia. No visual disturbance. No confusion,  somnolence. No numbness/tingling. No extremity weakness. No loss of bowel/bladder control other than some stress urinary incontinence since the birth of her second child in 07/2013.  Review of Systems As above.    Objective:   Physical Exam  Constitutional: She is oriented to person, place, and time. Vital signs are normal. She appears well-developed and well-nourished. She is active and cooperative. No distress.  BP 96/60 mmHg  Pulse 94  Temp(Src) 98.2 F (36.8 C) (Oral)  Resp 16  Ht 5\' 3"  (1.6 m)  Wt 121 lb 6.4 oz (55.067 kg)  BMI 21.51 kg/m2  SpO2 99%  Breastfeeding? Yes  HENT:  Head: Normocephalic and atraumatic.  Right Ear: Hearing normal.  Left Ear: Hearing normal.  Eyes: Conjunctivae are normal. No scleral icterus.  Neck: Normal range of motion. Neck supple. No thyromegaly present.  Cardiovascular: Normal rate, regular rhythm and normal heart sounds.   Pulses:      Radial pulses are 2+ on the right side, and 2+ on the left side.  Pulmonary/Chest: Effort normal and breath sounds normal.  Abdominal: Soft. Bowel sounds are normal. She exhibits no distension and no mass. There is no tenderness. There is no rebound and no guarding.  Lymphadenopathy:       Head (right side): No tonsillar, no preauricular, no posterior auricular and no occipital adenopathy present.       Head (left side): No tonsillar, no preauricular, no posterior auricular and  no occipital adenopathy present.    She has no cervical adenopathy.       Right: No supraclavicular adenopathy present.       Left: No supraclavicular adenopathy present.  Neurological: She is alert and oriented to person, place, and time. She has normal strength. No cranial nerve deficit or sensory deficit. Gait normal.  Reflex Scores:      Bicep reflexes are 2+ on the right side and 2+ on the left side.      Patellar reflexes are 2+ on the right side and 2+ on the left side. Skin: Skin is warm, dry and intact. No rash noted. No  cyanosis or erythema. Nails show no clubbing.  Psychiatric: She has a normal mood and affect. Her speech is normal and behavior is normal.      Results for orders placed or performed in visit on 04/21/14  POCT CBC  Result Value Ref Range   WBC 5.5 4.6 - 10.2 K/uL   Lymph, poc 1.0 0.6 - 3.4   POC LYMPH PERCENT 17.9 10 - 50 %L   MID (cbc) 0.4 0 - 0.9   POC MID % 6.5 0 - 12 %M   POC Granulocyte 4.2 2 - 6.9   Granulocyte percent 75.6 37 - 80 %G   RBC 4.76 4.04 - 5.48 M/uL   Hemoglobin 13.9 12.2 - 16.2 g/dL   HCT, POC 16.142.2 09.637.7 - 47.9 %   MCV 88.5 80 - 97 fL   MCH, POC 29.2 27 - 31.2 pg   MCHC 33.0 31.8 - 35.4 g/dL   RDW, POC 04.512.8 %   Platelet Count, POC 175 142 - 424 K/uL   MPV 6.3 0 - 99.8 fL       Assessment & Plan:  1. Acute nonintractable headache, unspecified headache type 2. Non-intractable vomiting with nausea, vomiting of unspecified type No evidence of meningitis or encephalitis. Needs to hydrate. Zofran is L2 (insuffiencent data, but probably safe). Infant monitoring information provided. Suspect that when she can rest and hydrate her symptoms will resolve. Monitor for worsening or new symptoms, or current symptoms persisting >1 week. - POCT CBC - Comprehensive metabolic panel   Fernande Brashelle S. Elizaveta Mattice, PA-C Physician Assistant-Certified [facility]

## 2014-04-21 NOTE — Patient Instructions (Signed)
Zofran use during lactation: L2 (Limited data, probably compatible) Infant monitoring while you take Zofran: sedation, irritability, diarrhea or constipation, urinary retention

## 2014-04-26 ENCOUNTER — Encounter: Payer: Self-pay | Admitting: Physician Assistant

## 2015-03-07 LAB — OB RESULTS CONSOLE HEPATITIS B SURFACE ANTIGEN: Hepatitis B Surface Ag: NEGATIVE

## 2015-03-07 LAB — OB RESULTS CONSOLE HIV ANTIBODY (ROUTINE TESTING): HIV: NONREACTIVE

## 2015-03-07 LAB — OB RESULTS CONSOLE RUBELLA ANTIBODY, IGM: Rubella: IMMUNE

## 2015-03-07 LAB — OB RESULTS CONSOLE RPR: RPR: NONREACTIVE

## 2015-03-15 LAB — OB RESULTS CONSOLE GC/CHLAMYDIA
CHLAMYDIA, DNA PROBE: NEGATIVE
GC PROBE AMP, GENITAL: NEGATIVE

## 2015-05-22 NOTE — L&D Delivery Note (Signed)
Delivery Note  First Stage: Induction of labor Cervical balloon placed @ 0910 SROM after balloon placement @ 0913 Augmentation with pitocin @ 1900 Labor onset 2000 Analgesia /Anesthesia intrapartum: epidural  Second Stage: Complete dilation at 2353 Onset of pushing at 2355 FHR second stage category 2  Delivery of a viable female at [id number] by CNM in LOA position no nuchal cord Cord double clamped after cessation of pulsation, cut by FOB Cord blood sample collected   Third Stage: Placenta delivered [facility] intact with 3 VC @ 0053 Placenta disposition: home with client for encapsulation Uterine tone firm / bleeding small  1st degree perienal laceration identified  Anesthesia for repair: epidural Repair 4-0 subcuticular repair Est. Blood Loss (mL): 200  Complications: none  Mom to postpartum.  Baby to Couplet care / Skin to Skin.  Newborn: Birth Weight: 8-10 Apgar Scores: 8-9 Feeding planned: breast  Marlinda MikeBAILEY, Dakiya Puopolo CNM, MSN, FACNM 10/22/2015, 1:00 AM

## 2015-07-18 LAB — OB RESULTS CONSOLE RPR: RPR: NONREACTIVE

## 2015-09-14 LAB — OB RESULTS CONSOLE GBS: GBS: POSITIVE

## 2015-10-21 ENCOUNTER — Inpatient Hospital Stay (HOSPITAL_COMMUNITY)
Admission: AD | Admit: 2015-10-21 | Discharge: 2015-10-23 | DRG: 775 | Disposition: A | Discharge status: Home / Self Care | Payer: 59 | Source: Ambulatory Visit | Attending: Obstetrics & Gynecology | Admitting: Obstetrics & Gynecology

## 2015-10-21 ENCOUNTER — Inpatient Hospital Stay (HOSPITAL_COMMUNITY): Payer: 59 | Admitting: Anesthesiology

## 2015-10-21 ENCOUNTER — Encounter (HOSPITAL_COMMUNITY): Payer: Self-pay | Admitting: *Deleted

## 2015-10-21 DIAGNOSIS — Z3A41 41 weeks gestation of pregnancy: Secondary | ICD-10-CM

## 2015-10-21 DIAGNOSIS — O480 Post-term pregnancy: Secondary | ICD-10-CM | POA: Diagnosis present

## 2015-10-21 LAB — CBC
HCT: 39.2 % (ref 36.0–46.0)
Hemoglobin: 13.4 g/dL (ref 12.0–15.0)
MCH: 30.1 pg (ref 26.0–34.0)
MCHC: 34.2 g/dL (ref 30.0–36.0)
MCV: 88.1 fL (ref 78.0–100.0)
Platelets: 139 10*3/uL — ABNORMAL LOW (ref 150–400)
RBC: 4.45 MIL/uL (ref 3.87–5.11)
RDW: 14.3 % (ref 11.5–15.5)
WBC: 8.2 10*3/uL (ref 4.0–10.5)

## 2015-10-21 LAB — TYPE AND SCREEN
ABO/RH(D): O POS
Antibody Screen: NEGATIVE

## 2015-10-21 MED ORDER — PHENYLEPHRINE 40 MCG/ML (10ML) SYRINGE FOR IV PUSH (FOR BLOOD PRESSURE SUPPORT)
80.0000 ug | PREFILLED_SYRINGE | INTRAVENOUS | Status: DC | PRN
  Filled 2015-10-21: qty 5
  Filled 2015-10-21: qty 10

## 2015-10-21 MED ORDER — OXYCODONE-ACETAMINOPHEN 5-325 MG PO TABS
2.0000 | ORAL_TABLET | ORAL | Status: DC | PRN
Start: 2015-10-21 — End: 2015-10-22

## 2015-10-21 MED ORDER — DIPHENHYDRAMINE HCL 50 MG/ML IJ SOLN: 12.5000 mg | INTRAMUSCULAR | Status: DC | PRN

## 2015-10-21 MED ORDER — EPHEDRINE 5 MG/ML INJ
10.0000 mg | INTRAVENOUS | Status: DC | PRN
  Filled 2015-10-21: qty 2

## 2015-10-21 MED ORDER — SOD CITRATE-CITRIC ACID 500-334 MG/5ML PO SOLN: 30.0000 mL | ORAL | Status: DC | PRN

## 2015-10-21 MED ORDER — PHENYLEPHRINE 40 MCG/ML (10ML) SYRINGE FOR IV PUSH (FOR BLOOD PRESSURE SUPPORT)
80.0000 ug | PREFILLED_SYRINGE | INTRAVENOUS | Status: DC | PRN
  Filled 2015-10-21: qty 5

## 2015-10-21 MED ORDER — LIDOCAINE HCL (PF) 1 % IJ SOLN
30.0000 mL | INTRAMUSCULAR | Status: DC | PRN
  Filled 2015-10-21: qty 30

## 2015-10-21 MED ORDER — LACTATED RINGERS IV SOLN
500.0000 mL | Freq: Once | INTRAVENOUS | Status: AC
  Administered 2015-10-21: 500 mL via INTRAVENOUS

## 2015-10-21 MED ORDER — AMPICILLIN SODIUM 2 G IJ SOLR
2.0000 g | Freq: Once | INTRAMUSCULAR | Status: AC
  Administered 2015-10-21: 2 g via INTRAVENOUS
  Filled 2015-10-21: qty 2000

## 2015-10-21 MED ORDER — OXYTOCIN 40 UNITS IN LACTATED RINGERS INFUSION - SIMPLE MED
1.0000 m[IU]/min | INTRAVENOUS | Status: DC
  Administered 2015-10-21: 2 m[IU]/min via INTRAVENOUS
  Filled 2015-10-21: qty 1000

## 2015-10-21 MED ORDER — OXYTOCIN 40 UNITS IN LACTATED RINGERS INFUSION - SIMPLE MED: 2.5000 [IU]/h | INTRAVENOUS | Status: DC

## 2015-10-21 MED ORDER — SODIUM CHLORIDE 0.9 % IV SOLN
2.0000 g | Freq: Four times a day (QID) | INTRAVENOUS | Status: DC
  Administered 2015-10-21 (×2): 2 g via INTRAVENOUS
  Filled 2015-10-21 (×3): qty 2000

## 2015-10-21 MED ORDER — OXYCODONE-ACETAMINOPHEN 5-325 MG PO TABS
1.0000 | ORAL_TABLET | ORAL | Status: DC | PRN
Start: 2015-10-21 — End: 2015-10-22

## 2015-10-21 MED ORDER — LACTATED RINGERS IV SOLN
500.0000 mL | INTRAVENOUS | Status: DC | PRN
  Administered 2015-10-21: 1000 mL via INTRAVENOUS

## 2015-10-21 MED ORDER — ACETAMINOPHEN 325 MG PO TABS: 650.0000 mg | ORAL_TABLET | ORAL | Status: DC | PRN

## 2015-10-21 MED ORDER — OXYTOCIN BOLUS FROM INFUSION: 500.0000 mL | INTRAVENOUS | Status: DC

## 2015-10-21 MED ORDER — FENTANYL 2.5 MCG/ML BUPIVACAINE 1/10 % EPIDURAL INFUSION (WH - ANES)
14.0000 mL/h | INTRAMUSCULAR | Status: DC | PRN
  Administered 2015-10-21 (×2): 14 mL/h via EPIDURAL
  Filled 2015-10-21: qty 125

## 2015-10-21 NOTE — Progress Notes (Signed)
S:  Her for IOL - desires limited intervention as necessary  O:  VS: Blood pressure 111/74, pulse 82, temperature 98 F (36.7 C), temperature source Oral, resp. rate 18, height 5\' 3"  (1.6 m), weight 71.668 kg (158 lb), currently breastfeeding.        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

## 2015-10-21 NOTE — Anesthesia Pain Management Evaluation Note (Signed)
  CRNA Pain Management Visit Note  Patient: Kathryn Ferguson, 30 y.o., female  "Hello I am a member of the anesthesia team at [facility]. We have an anesthesia team available at all times to provide care throughout the hospital, including epidural management and anesthesia for C-section. I don't know your plan for the delivery whether it a natural birth, water birth, IV sedation, nitrous supplementation, doula or epidural, but we want to meet your pain goals."   1.Was your pain managed to your expectations on prior hospitalizations?   Yes   2.What is your expectation for pain management during this hospitalization?     Patient undecided what to use for pain control. Considering epidural but does not want now.  3.How can we help you reach that goal? Possible eoidural  Record the patient's initial score and the patient's pain goal.   Pain: 2  Pain Goal: 8 [facility] wants you to be able to say your pain was always managed very well.  Kathryn Ferguson 10/21/2015

## 2015-10-21 NOTE — Progress Notes (Signed)
S:  Not much happening as far as labor        Now considering pitocin -planned to avoid intervention and pitocin as wants water birth  O:  VS: Blood pressure 101/60, pulse 82, temperature 98.9 F (37.2 C), temperature source Oral, resp. rate 16, height 5\' 3"  (1.6 m), weight 71.668 kg (158 lb), currently breastfeeding.        FHR : baseline 145 / variability moderate / accelerations + / no decelerations        Toco: contractions every 5-10 minutes / mild         Cervix : deferred        Membranes: clear  A: prodromal labor     FHR category 1  P: recommend pitocin augmentation - will not exclude water birth as will stop pitocin with active labor and transition to water immersion for birth         risks and benefits for expectant management versus pitocin versus oral cytotec options      desires to eat dinner and then proceed with pitocin augmentation     Marlinda MikeBAILEY, TANYA CNM, MSN, [facility] 10/21/2015, 5:07 PM

## 2015-10-22 ENCOUNTER — Encounter (HOSPITAL_COMMUNITY): Payer: Self-pay

## 2015-10-22 LAB — CBC
HCT: 32.1 % — ABNORMAL LOW (ref 36.0–46.0)
Hemoglobin: 11 g/dL — ABNORMAL LOW (ref 12.0–15.0)
MCH: 30.1 pg (ref 26.0–34.0)
MCHC: 34.3 g/dL (ref 30.0–36.0)
MCV: 87.9 fL (ref 78.0–100.0)
Platelets: 124 10*3/uL — ABNORMAL LOW (ref 150–400)
RBC: 3.65 MIL/uL — ABNORMAL LOW (ref 3.87–5.11)
RDW: 14.2 % (ref 11.5–15.5)
WBC: 15.7 10*3/uL — ABNORMAL HIGH (ref 4.0–10.5)

## 2015-10-22 LAB — RPR: RPR Ser Ql: NONREACTIVE

## 2015-10-22 MED ORDER — COCONUT OIL OIL
1.0000 "application " | TOPICAL_OIL | Status: DC | PRN
  Administered 2015-10-22: 1 via TOPICAL
  Filled 2015-10-22: qty 120

## 2015-10-22 MED ORDER — SIMETHICONE 80 MG PO CHEW: 80.0000 mg | CHEWABLE_TABLET | ORAL | Status: DC | PRN

## 2015-10-22 MED ORDER — BENZOCAINE-MENTHOL 20-0.5 % EX AERO
1.0000 "application " | INHALATION_SPRAY | CUTANEOUS | Status: DC | PRN
  Administered 2015-10-22: 1 via TOPICAL
  Filled 2015-10-22: qty 56

## 2015-10-22 MED ORDER — PHENYLEPHRINE 40 MCG/ML (10ML) SYRINGE FOR IV PUSH (FOR BLOOD PRESSURE SUPPORT)
80.0000 ug | PREFILLED_SYRINGE | INTRAVENOUS | Status: DC | PRN
  Filled 2015-10-22: qty 5

## 2015-10-22 MED ORDER — ACETAMINOPHEN 325 MG PO TABS: 650.0000 mg | ORAL_TABLET | ORAL | Status: DC | PRN

## 2015-10-22 MED ORDER — WITCH HAZEL-GLYCERIN EX PADS
1.0000 | MEDICATED_PAD | CUTANEOUS | Status: DC | PRN
Start: 2015-10-22 — End: 2015-10-23

## 2015-10-22 MED ORDER — LIDOCAINE HCL (PF) 1 % IJ SOLN
INTRAMUSCULAR | Status: DC | PRN
  Administered 2015-10-21 (×2): 5 mL via EPIDURAL

## 2015-10-22 MED ORDER — DIBUCAINE 1 % RE OINT: 1.0000 "application " | TOPICAL_OINTMENT | RECTAL | Status: DC | PRN

## 2015-10-22 MED ORDER — EPHEDRINE 5 MG/ML INJ
10.0000 mg | INTRAVENOUS | Status: DC | PRN
  Filled 2015-10-22: qty 2

## 2015-10-22 MED ORDER — LACTATED RINGERS IV SOLN: 500.0000 mL | Freq: Once | INTRAVENOUS | Status: DC

## 2015-10-22 MED ORDER — SENNOSIDES-DOCUSATE SODIUM 8.6-50 MG PO TABS
2.0000 | ORAL_TABLET | ORAL | Status: DC
  Administered 2015-10-23: 2 via ORAL
  Filled 2015-10-22: qty 2

## 2015-10-22 MED ORDER — IBUPROFEN 600 MG PO TABS
600.0000 mg | ORAL_TABLET | Freq: Four times a day (QID) | ORAL | Status: DC
  Administered 2015-10-22 – 2015-10-23 (×6): 600 mg via ORAL
  Filled 2015-10-22 (×6): qty 1

## 2015-10-22 NOTE — Progress Notes (Signed)
S:  Ctx painful - wants to get into tub and pitocin to be turned off       Also considering nitrous and epidural  O:  VS: Blood pressure 113/70, pulse 87, temperature 98.3 F (36.8 C), temperature source Oral, resp. rate 18, height 5\' 3"  (1.6 m), weight 71.668 kg (158 lb), SpO2 98 %, currently breastfeeding.        FHR : baseline 130 / variability moderate / accelerations absent / variable decelerations        Toco: contractions every 2-4 minutes / moderate / pitocin 806mu/min        Cervix : 7cm / 90% / vtx +1        Membranes: clear fluid with show  A: active labor     FHR category 2  P: patient decided to proceed with epidural - anesthesia notified        anticipate rapid progression to SVB         Pitocin DC   Janny Crute CNM, MSN, [facility]

## 2015-10-22 NOTE — Anesthesia Procedure Notes (Addendum)
Epidural Patient location during procedure: OB Start time: 10/21/2015 11:20 PM End time: 10/21/2015 11:25 PM  Staffing Anesthesiologist: Ronelle NighEWELL, Salam Micucci Performed by: anesthesiologist   Preanesthetic Checklist Completed: patient identified, site marked, surgical consent, pre-op evaluation, timeout performed, IV checked, risks and benefits discussed and monitors and equipment checked  Epidural Patient position: sitting Prep: site prepped and draped and DuraPrep Patient monitoring: continuous pulse ox and blood pressure Approach: midline Location: L3-L4 Injection technique: LOR air  Needle:  Needle type: Tuohy  Needle gauge: 17 G Needle length: 9 cm and 9 Needle insertion depth: 4 cm Catheter type: closed end flexible Catheter size: 19 Gauge Catheter at skin depth: 9 cm Test dose: negative  Assessment Sensory level: T10 Events: blood not aspirated, injection not painful, no injection resistance, negative IV test and no paresthesia  Additional Notes Patient identified. Risks/Benefits/Options discussed with patient including but not limited to bleeding, infection, nerve damage, paralysis, failed block, incomplete pain control, headache, blood pressure changes, nausea, vomiting, reactions to medication both or allergic, itching and postpartum back pain. Confirmed with bedside nurse the patient's most recent platelet count. Confirmed with patient that they are not currently taking any anticoagulation, have any bleeding history or any family history of bleeding disorders. Patient expressed understanding and wished to proceed. All questions were answered. Sterile technique was used throughout the entire procedure. Please see nursing notes for vital signs. Test dose was given through epidural catheter and negative prior to continuing to dose epidural or start infusion. Warning signs of high block given to the patient including shortness of breath, tingling/numbness in hands, complete motor  block, or any concerning symptoms with instructions to call for help. Patient was given instructions on fall risk and not to get out of bed. All questions and concerns addressed with instructions to call with any issues or inadequate analgesia.

## 2015-10-22 NOTE — Anesthesia Postprocedure Evaluation (Signed)
Anesthesia Post Note  Patient: Kathryn Ferguson  Procedure(s) Performed: * No procedures listed *  Patient location during evaluation: Mother Baby Anesthesia Type: Epidural Level of consciousness: awake Pain management: pain level controlled Vital Signs Assessment: post-procedure vital signs reviewed and stable Respiratory status: spontaneous breathing Cardiovascular status: stable Postop Assessment: no headache, no backache, epidural receding, patient able to bend at knees, no signs of nausea or vomiting and adequate PO intake Anesthetic complications: no     Last Vitals:  Filed Vitals:   10/22/15 0319 10/22/15 0420  BP: 103/58 100/53  Pulse: 67 72  Temp: 36.8 C 37.2 C  Resp: 18 16    Last Pain:  Filed Vitals:   10/22/15 0704  PainSc: 3    Pain Goal: Patients Stated Pain Goal: 8 (10/21/15 1715)               Kathryn Ferguson

## 2015-10-22 NOTE — Lactation Note (Signed)
This note was copied from a baby's chart. Lactation Consultation Note  Patient Name: Kathryn Ferguson ZOXWR'UToday's Date: 10/22/2015 Reason for consult: Initial assessment Baby at 16 hr of life. Mom is reporting bilateral sore nipples, no skin break down at this time, coconut oil in the room. With bf her oldest child she had cracked bleeding nipples for over a month. With bf her middle child she had cracked bleeding nipples for 2 wk. Baby had just fed, suggested some positioning ideas for the next bf and suggested she call for help. She is aware of lactation services and support.   Maternal Data    Feeding Length of feed: 15 min  LATCH Score/Interventions                      Lactation Tools Discussed/Used WIC Program: No   Consult Status Consult Status: Follow-up Date: 10/23/15 Follow-up type: In-patient    Kathryn Ferguson 10/22/2015, 5:01 PM

## 2015-10-22 NOTE — Anesthesia Preprocedure Evaluation (Signed)
Anesthesia Evaluation  Patient identified by MRN, date of birth, ID band Patient awake    Reviewed: Allergy & Precautions, H&P , NPO status , Patient's Chart, lab work & pertinent test results  Airway Mallampati: II  TM Distance: >3 FB Neck ROM: full    Dental no notable dental hx. (+) Dental Advisory Given, Teeth Intact   Pulmonary neg pulmonary ROS,    Pulmonary exam normal breath sounds clear to auscultation       Cardiovascular Exercise Tolerance: Good negative cardio ROS Normal cardiovascular exam Rhythm:regular Rate:Normal     Neuro/Psych negative neurological ROS  negative psych ROS   GI/Hepatic negative GI ROS, Neg liver ROS,   Endo/Other  negative endocrine ROS  Renal/GU negative Renal ROS  negative genitourinary   Musculoskeletal   Abdominal   Peds  Hematology Platelets 129K   Anesthesia Other Findings   Reproductive/Obstetrics negative OB ROS (+) Pregnancy                             Anesthesia Physical Anesthesia Plan  ASA: II  Anesthesia Plan: Epidural   Post-op Pain Management:    Induction:   Airway Management Planned:   Additional Equipment:   Intra-op Plan:   Post-operative Plan:   Informed Consent: I have reviewed the patients History and Physical, chart, labs and discussed the procedure including the risks, benefits and alternatives for the proposed anesthesia with the patient or authorized representative who has indicated his/her understanding and acceptance.   Dental Advisory Given  Plan Discussed with: CRNA and Surgeon  Anesthesia Plan Comments:         Anesthesia Quick Evaluation

## 2015-10-22 NOTE — Progress Notes (Signed)
Patient passed apple sized clot while voiding, moderate amount of rubra noted in pad, no further s/sx of distress noted.  Fundus firm at u/1.  Pt instructed to call if continues to pass clots or bleeding enough to fill pad in < 1 hr

## 2015-10-23 MED ORDER — IBUPROFEN 600 MG PO TABS: 600.0000 mg | ORAL_TABLET | Freq: Four times a day (QID) | ORAL | Status: AC

## 2015-10-23 NOTE — Lactation Note (Signed)
This note was copied from a baby's chart. Lactation Consultation Note: Experienced BF mom reports baby has been nursing well. History of very sore nipples with previous babies for 2 months with first baby and 2 Eann Cleland with second baby. Mom reports left nipple is beginning to get sore. Both nipples pink, left with raw area on tip. Encouraged EBM to nipples after nursing. Has coconut oil. Comfort gels given with instructions for use. Mom reports she has used them before. Suggested changing baby's position at the breast throughout the day to help nipples to heal, No questions at present. Reviewed our phone number to call with questions/concerns, BFSG and OP appointments. To call prn Patient Name: Kathryn Ferguson CampionMaxine Haviland WUJWJ'XToday's Date: 10/23/2015 Reason for consult: Follow-up assessment   Maternal Data Formula Feeding for Exclusion: No Has patient been taught Hand Expression?: Yes Does the patient have breastfeeding experience prior to this delivery?: Yes  Feeding Feeding Type: Breast Fed Length of feed: 90 min (per mom infant has been off and on)  LATCH Score/Interventions Latch: Grasps breast easily, tongue down, lips flanged, rhythmical sucking.  Audible Swallowing: Spontaneous and intermittent  Type of Nipple: Everted at rest and after stimulation  Comfort (Breast/Nipple): Filling, red/small blisters or bruises, mild/mod discomfort  Problem noted: Mild/Moderate discomfort Interventions (Mild/moderate discomfort): Comfort gels;Hand expression  Hold (Positioning): No assistance needed to correctly position infant at breast.  LATCH Score: 9  Lactation Tools Discussed/Used     Consult Status Consult Status: Complete    Pamelia HoitWeeks, Nisreen Guise D 10/23/2015, 10:34 AM

## 2015-10-23 NOTE — H&P (Signed)
OB ADMISSION/ HISTORY & PHYSICAL:  Admission Date: 10/21/2015  8:02 AM  Admit Diagnosis: post-dates  Kathryn Ferguson is a 30 y.o. female presenting for induction of labor - post-dates.  Prenatal History: [id number]   EDC : 10/09/2015, by Other Basis  [facility]  Primary Ob Provider: Fredric MareBailey CNM Prenatal course complicated by hx LGA previous pregnancy / + GBS / postdates  Prenatal Labs: ABO, Rh: --/--/O POS (06/02 0935) Antibody: NEG (06/02 0935) Rubella: Immune (10/17 0000)  RPR: Non Reactive (06/02 0935)  HBsAg: Negative (10/17 0000)  HIV: Non-reactive (10/17 0000)  GTT: NL GBS: Positive (04/26 0000)   Medical / Surgical History :  Past medical history:  Past Medical History  Diagnosis Date  . Abnormal Pap smear   . HPV (human papilloma virus) anogenital infection   . Other specified fetal and placental problems affecting management of mother, unspecified as to episode of care     sch  . H/O varicella   . Nonspecific abnormal finding in amniotic fluid     polyhydramnios  . Normal labor 01/03/2012  . Postpartum care following vaginal delivery (8/16) 01/04/2012  . Gestational thrombocytopenia (HCC) 01/04/2012  . Vaginal Pap smear, abnormal   . Postpartum care following vaginal delivery (3/10) 07/28/2013  . Dyspareunia 04/20/2009    Qualifier: Diagnosis of  By: Wendee CoppFiato, CMA, Carol       Past surgical history:  Past Surgical History  Procedure Laterality Date  . No past surgeries      Family History:  Family History  Problem Relation Age of Onset  . Thyroid disease Mother   . Nephrolithiasis Mother   . Heart attack Maternal Aunt   . Nephrolithiasis Maternal Grandfather   . Heart disease Maternal Grandfather   . Cancer Paternal Grandfather     brain  . Hemophilia Paternal Grandfather   . Heart disease Maternal Grandmother      Social History:  reports that she has never smoked. She has never used smokeless tobacco. She reports that she  drinks about 1.2 oz of alcohol per week. She reports that she does not use illicit drugs.   Allergies: Cefaclor    Current Medications at time of admission:  Prior to Admission medications   Medication Sig Start Date End Date Taking? Authorizing Provider  Prenatal Vit-Fe Fumarate-FA (PRENATAL MULTIVITAMIN) TABS Take 1 tablet by mouth daily.   Yes Historical Provider, MD  ibuprofen (ADVIL,MOTRIN) 600 MG tablet Take 1 tablet (600 mg total) by mouth every 6 (six) hours. 10/23/15   Marlinda Mikeanya Idali Lafever, CNM   Review of Systems: Active FM  Physical Exam:  VS: Blood pressure 99/67, pulse 66, temperature 98.3 F (36.8 C), temperature source Oral, resp. rate 16, height 5\' 3"  (1.6 m), weight 71.668 kg (158 lb), SpO2 100 %, unknown if currently breastfeeding.  General: alert and oriented, appears comfortable Heart: RRR Lungs: Clear lung fields Abdomen: Gravid, soft and non-tender, non-distended / uterus: gravid Extremities: trace  edema  Genitalia / VE: Dilation: 10 Effacement (%): 100 Station: +2 Exam by:: Fredric MareBailey, T, CNM  FHR: baseline rate 140 / variability moderate / accelerations + / no decelerations TOCO: irregualr  Assessment: 41+ weeks gestation FHR category 1  Plan:  Admit IOL - cervical balloon to AROM Dr Juliene PinaMody notified of admission / plan of care   Marlinda MikeBAILEY, Corin Tilly CNM, MSN, [facility] 10/23/2015, 9:42 AM

## 2015-10-23 NOTE — Discharge Summary (Signed)
Obstetric Discharge Summary  Reason for Admission: onset of labor Prenatal Procedures: none Intrapartum Procedures: spontaneous vaginal delivery, GBS prophylaxis and epidural Postpartum Procedures: none Complications-Operative and Postpartum: 1st degree perineal laceration HEMOGLOBIN  Date Value Ref Range Status  10/22/2015 11.0* 12.0 - 15.0 g/dL Final  45/40/981112/06/2013 91.413.9 12.2 - 16.2 g/dL Final   HCT  Date Value Ref Range Status  10/22/2015 32.1* 36.0 - 46.0 % Final   HCT, POC  Date Value Ref Range Status  04/21/2014 42.2 37.7 - 47.9 % Final    Physical Exam:  General: alert, cooperative and no distress Lochia: appropriate Uterine Fundus: firm Incision: healing well DVT Evaluation: No evidence of DVT seen on physical exam.  Discharge Diagnoses: Term Pregnancy-delivered  Discharge Information: Date: 10/23/2015 Activity: pelvic rest Diet: routine Medications: PNV and Ibuprofen Condition: stable Instructions: refer to practice specific booklet Discharge to: home Follow-up Information    Follow up with Marlinda MikeBAILEY, Rosaire Cueto, CNM. Schedule an appointment as soon as possible for a visit in 6 weeks.   Specialty:  Obstetrics and Gynecology   Contact information:   [address] [place] [id number] [phone]       Newborn Data: Live born female  Birth Weight: 8 lb 10.3 oz (3920 g) APGAR: 8, 9  Home with mother.  Marlinda MikeBAILEY, Jolynda Townley 10/23/2015, 9:40 AM

## 2015-10-23 NOTE — Progress Notes (Signed)
PPD 1 SVD  S:  Reports feeling well - ready to go home             Tolerating po/ No nausea or vomiting             Bleeding is moderate             Pain controlled withmotrin             Up ad lib / ambulatory / voiding QS  Newborn breast feeding O:               VS: BP 99/67 mmHg  Pulse 66  Temp(Src) 98.3 F (36.8 C) (Oral)  Resp 16  Ht 5\' 3"  (1.6 m)  Wt 71.668 kg (158 lb)  BMI 28.00 kg/m2  SpO2 100%  Breastfeeding? Unknown   LABS:              Recent Labs  10/21/15 0935 10/22/15 0628  WBC 8.2 15.7*  HGB 13.4 11.0*  PLT 139* 124*               Blood type: --/--/O POS (06/02 0935)  Rubella: Immune (10/17 0000)                       Physical Exam:             Alert and oriented X3  Abdomen: soft, non-tender, non-distended              Fundus: firm, non-tender, U-1  Perineum: no edema  Lochia: light  Extremities:  No edema, no calf pain or tenderness    A: PPD # 1   Doing well - stable status  P: Routine post partum orders  DC home  Marlinda MikeBAILEY, Klara Stjames CNM, MSN, [facility] 10/23/2015, 9:39 AM

## 2015-10-30 ENCOUNTER — Inpatient Hospital Stay (HOSPITAL_COMMUNITY)
Admission: AD | Admit: 2015-10-30 | Discharge: 2015-10-30 | Disposition: A | Discharge status: Home / Self Care | Payer: 59 | Source: Ambulatory Visit | Attending: Obstetrics and Gynecology | Admitting: Obstetrics and Gynecology

## 2015-10-30 ENCOUNTER — Encounter (HOSPITAL_COMMUNITY): Payer: Self-pay | Admitting: *Deleted

## 2015-10-30 DIAGNOSIS — M542 Cervicalgia: Secondary | ICD-10-CM | POA: Diagnosis not present

## 2015-10-30 DIAGNOSIS — Z880 Allergy status to penicillin: Secondary | ICD-10-CM | POA: Diagnosis not present

## 2015-10-30 DIAGNOSIS — O26893 Other specified pregnancy related conditions, third trimester: Secondary | ICD-10-CM | POA: Diagnosis not present

## 2015-10-30 DIAGNOSIS — R519 Headache, unspecified: Secondary | ICD-10-CM

## 2015-10-30 DIAGNOSIS — R51 Headache: Secondary | ICD-10-CM | POA: Insufficient documentation

## 2015-10-30 DIAGNOSIS — Z3A41 41 weeks gestation of pregnancy: Secondary | ICD-10-CM | POA: Diagnosis not present

## 2015-10-30 DIAGNOSIS — O9089 Other complications of the puerperium, not elsewhere classified: Secondary | ICD-10-CM | POA: Diagnosis not present

## 2015-10-30 LAB — URINALYSIS, ROUTINE W REFLEX MICROSCOPIC
Bilirubin Urine: NEGATIVE
Glucose, UA: NEGATIVE mg/dL
Ketones, ur: NEGATIVE mg/dL
Leukocytes, UA: NEGATIVE
Nitrite: NEGATIVE
Protein, ur: NEGATIVE mg/dL
Specific Gravity, Urine: <1.005 — ABNORMAL LOW (ref 1.005–1.030)
pH: 5.5 (ref 5.0–8.0)

## 2015-10-30 LAB — COMPREHENSIVE METABOLIC PANEL
ALT: 19 U/L (ref 14–54)
AST: 20 U/L (ref 15–41)
Albumin: 3.2 g/dL — ABNORMAL LOW (ref 3.5–5.0)
Alkaline Phosphatase: 83 U/L (ref 38–126)
Anion gap: 6 (ref 5–15)
BUN: 16 mg/dL (ref 6–20)
CO2: 26 mmol/L (ref 22–32)
Calcium: 8.9 mg/dL (ref 8.9–10.3)
Chloride: 107 mmol/L (ref 101–111)
Creatinine, Ser: 0.66 mg/dL (ref 0.44–1.00)
GFR calc Af Amer: >60 mL/min (ref >60)
GFR calc non Af Amer: >60 mL/min (ref >60)
Glucose, Bld: 89 mg/dL (ref 65–99)
Potassium: 4.1 mmol/L (ref 3.5–5.1)
Sodium: 139 mmol/L (ref 135–145)
Total Bilirubin: 0.4 mg/dL (ref 0.3–1.2)
Total Protein: 6.9 g/dL (ref 6.5–8.1)

## 2015-10-30 LAB — URINE MICROSCOPIC-ADD ON: Bacteria, UA: NONE SEEN

## 2015-10-30 LAB — CBC
HCT: 38 % (ref 36.0–46.0)
Hemoglobin: 12.8 g/dL (ref 12.0–15.0)
MCH: 29.8 pg (ref 26.0–34.0)
MCHC: 33.7 g/dL (ref 30.0–36.0)
MCV: 88.6 fL (ref 78.0–100.0)
Platelets: 213 10*3/uL (ref 150–400)
RBC: 4.29 MIL/uL (ref 3.87–5.11)
RDW: 13.9 % (ref 11.5–15.5)
WBC: 6.3 10*3/uL (ref 4.0–10.5)

## 2015-10-30 MED ORDER — CYCLOBENZAPRINE HCL 5 MG PO TABS: 5.0000 mg | ORAL_TABLET | Freq: Three times a day (TID) | ORAL | Status: DC | PRN

## 2015-10-30 MED ORDER — DIPHENHYDRAMINE HCL 50 MG/ML IJ SOLN
12.5000 mg | Freq: Once | INTRAMUSCULAR | Status: AC
  Administered 2015-10-30: 12.5 mg via INTRAVENOUS
  Filled 2015-10-30: qty 1

## 2015-10-30 MED ORDER — LACTATED RINGERS IV BOLUS (SEPSIS)
1000.0000 mL | Freq: Once | INTRAVENOUS | Status: AC
  Administered 2015-10-30: 1000 mL via INTRAVENOUS

## 2015-10-30 MED ORDER — DEXAMETHASONE SODIUM PHOSPHATE 10 MG/ML IJ SOLN
10.0000 mg | Freq: Once | INTRAMUSCULAR | Status: AC
  Administered 2015-10-30: 10 mg via INTRAVENOUS
  Filled 2015-10-30: qty 1

## 2015-10-30 MED ORDER — METOCLOPRAMIDE HCL 5 MG/ML IJ SOLN
10.0000 mg | Freq: Once | INTRAMUSCULAR | Status: AC
  Administered 2015-10-30: 10 mg via INTRAVENOUS
  Filled 2015-10-30: qty 2

## 2015-10-30 NOTE — MAU Provider Note (Signed)
History     CSN: [id number]  Arrival date and time: 10/30/15 0340   First Provider Initiated Contact with Patient 10/30/15 [phone]      Chief Complaint  Patient presents with  . Headache  . Neck Pain   HPI   Ms. Kathryn Ferguson is a 30 y.o. female [phone] with a history of gestational thrombocytopenia, here in MAU with HA. She is status post uncomplicated vaginal delivery on June 4th, she went home on June 5th. She did have an epidural with labor.   She attests to mild neck soreness while in the hospital with worsening HA following discharge home (feels like I have a stiff neck) The HA is described as pressure along the left side of her head with pressure in her left eye. The pain is constant. No associated symptoms such as photophobia or N/V. She has tried tylenol, ibuprofen (600 mg) without relief.  She feels the HA is worse when she is laying in her bed, sitting up does not make the HA worse. She initially attributed the pain to be related to lack of sleep, patient had a restful night of sleep last night and woke up with continued HA pain.   History of occasional HA, no history of migraines.   OB History as of 10/29/15    Gravida Para Term Preterm AB TAB SAB Ectopic Multiple Living   '3 3 3 ' 0 0 0 0 0 0 3      Past Medical History  Diagnosis Date  . Abnormal Pap smear   . HPV (human papilloma virus) anogenital infection   . Other specified fetal and placental problems affecting management of mother, unspecified as to episode of care     sch  . H/O varicella   . Nonspecific abnormal finding in amniotic fluid     polyhydramnios  . Normal labor 01/03/2012  . Postpartum care following vaginal delivery (8/16) 01/04/2012  . Gestational thrombocytopenia ([place]) 01/04/2012  . Vaginal Pap smear, abnormal   . Postpartum care following vaginal delivery (3/10) 07/28/2013  . Dyspareunia 04/20/2009    Qualifier: Diagnosis of  By: Ronne Binning      Past Surgical History  Procedure  Laterality Date  . No past surgeries      Family History  Problem Relation Age of Onset  . Thyroid disease Mother   . Nephrolithiasis Mother   . Heart attack Maternal Aunt   . Nephrolithiasis Maternal Grandfather   . Heart disease Maternal Grandfather   . Cancer Paternal Grandfather     brain  . Hemophilia Paternal Grandfather   . Heart disease Maternal Grandmother     Social History  Substance Use Topics  . Smoking status: Never Smoker   . Smokeless tobacco: Never Used  . Alcohol Use: 1.2 oz/week    2 Glasses of wine per week    Allergies:  Allergies  Allergen Reactions  . Cefaclor Swelling    Joint swelling when 30 yrs old Has tolerated amoxicillin    Prescriptions prior to admission  Medication Sig Dispense Refill Last Dose  . acetaminophen (TYLENOL) 325 MG tablet Take 650 mg by mouth every 6 (six) hours as needed for moderate pain.   Past Week at Unknown time  . ibuprofen (ADVIL,MOTRIN) 600 MG tablet Take 1 tablet (600 mg total) by mouth every 6 (six) hours. 30 tablet 0 Past Week at Unknown time  . Prenatal Vit-Fe Fumarate-FA (PRENATAL MULTIVITAMIN) TABS Take 1 tablet by mouth daily.   10/29/2015 at Unknown  time   Results for orders placed or performed during the hospital encounter of 10/30/15 (from the past 48 hour(s))  Urinalysis, Routine w reflex microscopic (not at [facility])     Status: Abnormal   Collection Time: 10/30/15  7:52 AM  Result Value Ref Range   Color, Urine YELLOW YELLOW   APPearance CLEAR CLEAR   Specific Gravity, Urine <1.005 (L) 1.005 - 1.030   pH 5.5 5.0 - 8.0   Glucose, UA NEGATIVE NEGATIVE mg/dL   Hgb urine dipstick TRACE (A) NEGATIVE   Bilirubin Urine NEGATIVE NEGATIVE   Ketones, ur NEGATIVE NEGATIVE mg/dL   Protein, ur NEGATIVE NEGATIVE mg/dL   Nitrite NEGATIVE NEGATIVE   Leukocytes, UA NEGATIVE NEGATIVE  Urine microscopic-add on     Status: Abnormal   Collection Time: 10/30/15  7:52 AM  Result Value Ref Range   Squamous Epithelial / LPF  0-5 (A) NONE SEEN   WBC, UA 0-5 0 - 5 WBC/hpf   RBC / HPF 0-5 0 - 5 RBC/hpf   Bacteria, UA NONE SEEN NONE SEEN  CBC     Status: None   Collection Time: 10/30/15  8:10 AM  Result Value Ref Range   WBC 6.3 4.0 - 10.5 K/uL   RBC 4.29 3.87 - 5.11 MIL/uL   Hemoglobin 12.8 12.0 - 15.0 g/dL   HCT 38.0 36.0 - 46.0 %   MCV 88.6 78.0 - 100.0 fL   MCH 29.8 26.0 - 34.0 pg   MCHC 33.7 30.0 - 36.0 g/dL   RDW 13.9 11.5 - 15.5 %   Platelets 213 150 - 400 K/uL  Comprehensive metabolic panel     Status: Abnormal   Collection Time: 10/30/15  8:10 AM  Result Value Ref Range   Sodium 139 135 - 145 mmol/L   Potassium 4.1 3.5 - 5.1 mmol/L   Chloride 107 101 - 111 mmol/L   CO2 26 22 - 32 mmol/L   Glucose, Bld 89 65 - 99 mg/dL   BUN 16 6 - 20 mg/dL   Creatinine, Ser 0.66 0.44 - 1.00 mg/dL   Calcium 8.9 8.9 - 10.3 mg/dL   Total Protein 6.9 6.5 - 8.1 g/dL   Albumin 3.2 (L) 3.5 - 5.0 g/dL   AST 20 15 - 41 U/L   ALT 19 14 - 54 U/L   Alkaline Phosphatase 83 38 - 126 U/L   Total Bilirubin 0.4 0.3 - 1.2 mg/dL   GFR calc non Af Amer >60 >60 mL/min   GFR calc Af Amer >60 >60 mL/min    Comment: (NOTE) The eGFR has been calculated using the CKD EPI equation. This calculation has not been validated in all clinical situations. eGFR's persistently <60 mL/min signify possible Chronic Kidney Disease.    Anion gap 6 5 - 15    Review of Systems  Constitutional: Negative for fever and chills.  HENT: Negative for hearing loss.   Eyes: Negative for blurred vision.  Gastrointestinal: Negative for nausea and vomiting.  Musculoskeletal: Positive for neck pain.  Neurological: Positive for headaches.   Physical Exam   Blood pressure 119/78, pulse 58, temperature 97.8 F (36.6 C), temperature source Oral, resp. rate 16, unknown if currently breastfeeding.  Physical Exam  Constitutional: She is oriented to person, place, and time. She appears well-developed and well-nourished. No distress.  HENT:  Head:  Normocephalic.  Eyes: EOM and lids are normal.  Neck: Trachea normal and normal range of motion. Neck supple. Muscular tenderness present. No spinous process tenderness  present. No rigidity. No edema, no erythema and normal range of motion present.    Musculoskeletal: Normal range of motion.  Neurological: She is alert and oriented to person, place, and time. She has normal strength. No cranial nerve deficit or sensory deficit. Coordination and gait normal. GCS eye subscore is 4. GCS verbal subscore is 5. GCS motor subscore is 6.  Skin: She is not diaphoretic. There is pallor.  Psychiatric: Her behavior is normal.    MAU Course  Procedures  None  MDM  HA cocktail ordered per Dr. Ronita Hipps.  Patient responded well from Reglan 10, Decadron 10, benadryl 12.5> HA pain 0/10, she continues to have "mild" neck tenderness" but she is requesting to go home. Discussed patient with Dr. Benjie Karvonen @ 1015  Assessment and Plan    A:  1. Headache in pregnancy, postpartum   2. Nonspecific pain in the neck region   3. Neck pain, musculoskeletal     P:  Discharge home in stable condition Rx: Flexeril Call the office tomorrow if symptoms persist  Return to MAU if symptoms worsen Rest often Ok to continue using ibuprofen as directed    Lezlie Lye, NP 10/30/2015 1:02 PM

## 2015-10-30 NOTE — MAU Note (Signed)
Pt states she has been having a headache and neck pain since a day or two after she delivery on June 3rd.  Pt states she does not have a history of migraines but has had headaches in the past.  Pt states she has tried Tylenol and Ibuprofen and they only provide a small amount of relief and the last time she took anything was last night.  Pt states she did have an epidural during labor.

## 2015-10-30 NOTE — Discharge Instructions (Signed)

## 2016-02-17 ENCOUNTER — Ambulatory Visit (INDEPENDENT_AMBULATORY_CARE_PROVIDER_SITE_OTHER): Payer: 59 | Admitting: Family Medicine

## 2016-02-17 VITALS — BP 122/80 | HR 74 | Temp 98.1°F | Resp 18 | Ht 63.0 in | Wt 133.0 lb

## 2016-02-17 DIAGNOSIS — I951 Orthostatic hypotension: Secondary | ICD-10-CM | POA: Diagnosis not present

## 2016-02-17 DIAGNOSIS — R519 Headache, unspecified: Secondary | ICD-10-CM

## 2016-02-17 DIAGNOSIS — R51 Headache: Secondary | ICD-10-CM

## 2016-02-17 DIAGNOSIS — R42 Dizziness and giddiness: Secondary | ICD-10-CM | POA: Diagnosis not present

## 2016-02-17 LAB — POC MICROSCOPIC URINALYSIS (UMFC): MUCUS RE: ABSENT

## 2016-02-17 LAB — POCT URINALYSIS DIP (MANUAL ENTRY)
Bilirubin, UA: NEGATIVE
Glucose, UA: NEGATIVE
Ketones, POC UA: NEGATIVE
Leukocytes, UA: NEGATIVE
Nitrite, UA: NEGATIVE
Protein Ur, POC: NEGATIVE
RBC UA: NEGATIVE
SPEC GRAV UA: 1.015
UROBILINOGEN UA: 0.2
pH, UA: 7

## 2016-02-17 LAB — POCT CBC
Granulocyte percent: 50.9 %G (ref 37–80)
HCT, POC: 39.6 % (ref 37.7–47.9)
Hemoglobin: 13.8 g/dL (ref 12.2–16.2)
LYMPH, POC: 2.4 (ref 0.6–3.4)
MCH, POC: 30.4 pg (ref 27–31.2)
MCHC: 34.7 g/dL (ref 31.8–35.4)
MCV: 87.6 fL (ref 80–97)
MID (CBC): 0.5 (ref 0–0.9)
MPV: 6.1 fL (ref 0–99.8)
POC Granulocyte: 3 (ref 2–6.9)
POC LYMPH PERCENT: 40.6 %L (ref 10–50)
POC MID %: 8.5 % (ref 0–12)
Platelet Count, POC: 229 10*3/uL (ref 142–424)
RBC: 4.53 M/uL (ref 4.04–5.48)
RDW, POC: 13.5 %
WBC: 5.8 10*3/uL (ref 4.6–10.2)

## 2016-02-17 NOTE — Patient Instructions (Addendum)
   IF you received an x-ray today, you will receive an invoice from [facility]. Please contact  Radiology at [phone] with questions or concerns regarding your invoice.   IF you received labwork today, you will receive an invoice from [facility]. Please contact [facility] at [phone] with questions or concerns regarding your invoice.   Our billing staff will not be able to assist you with questions regarding bills from these companies.  You will be contacted with the lab results as soon as they are available. The fastest way to get your results is to activate your My Chart account. Instructions are located on the last page of this paperwork. If you have not heard from us regarding the results in 2 weeks, please contact this office.    Orthostatic Hypotension Orthostatic hypotension is a sudden drop in blood pressure. It happens when you quickly stand up from a seated or lying position. You may feel dizzy or light-headed. This can last for just a few seconds or for up to a few minutes. It is usually not a serious problem. However, if this happens frequently or gets worse, it can be a sign of something more serious. CAUSES  Different things can cause orthostatic hypotension, including:   Loss of body fluids (dehydration).  Medicines that lower blood pressure.  Sudden changes in posture, such as standing up quickly after you have been sitting or lying down.  Taking too much of your medicine. SIGNS AND SYMPTOMS   Light-headedness or dizziness.   Fainting or near-fainting.   A fast heart rate.   Weakness.   Feeling tired (fatigue).  DIAGNOSIS  Your health care provider may do several things to help diagnose your condition and identify the cause. These may include:   Taking a medical history and doing a physical exam.  Checking your blood pressure. Your health care provider will check your blood pressure when you  are:  Lying down.  Sitting.  Standing.  Using tilt table testing. In this test, you lie down on a table that moves from a lying position to a standing position. You will be strapped onto the table. This test monitors your blood pressure and heart rate when you are in different positions. TREATMENT  Treatment will vary depending on the cause. Possible treatments include:   Changing the dosage of your medicines.  Wearing compression stockings on your lower legs.  Standing up slowly after sitting or lying down.  Eating more salt.  Eating frequent, small meals.  In some cases, getting IV fluids.  Taking medicine to enhance fluid retention. HOME CARE INSTRUCTIONS  Only take over-the-counter or prescription medicines as directed by your health care provider.  Follow your health care provider's instructions for changing the dosage of your current medicines.  Do not stop or adjust your medicine on your own.  Stand up slowly after sitting or lying down. This allows your body to adjust to the different position.  Wear compression stockings as directed.  Eat extra salt as directed.  Do not add extra salt to your diet unless directed to by your health care provider.  Eat frequent, small meals.  Avoid standing suddenly after eating.  Avoid hot showers or excessive heat as directed by your health care provider.  Keep all follow-up appointments. SEEK MEDICAL CARE IF:  You continue to feel dizzy or light-headed after standing.  You feel groggy or confused.  You feel cold, clammy, or sick to your stomach (nauseous).  You have   blurred vision.  You feel short of breath. SEEK IMMEDIATE MEDICAL CARE IF:   You faint after standing.  You have chest pain.  You have difficulty breathing.   You lose feeling or movement in your arms or legs.   You have slurred speech or difficulty talking, or you are unable to talk.  MAKE SURE YOU:   Understand these  instructions.  Will watch your condition.  Will get help right away if you are not doing well or get worse.   This information is not intended to replace advice given to you by your health care provider. Make sure you discuss any questions you have with your health care provider.   Document Released: 04/27/2002 Document Revised: 05/12/2013 Document Reviewed: 02/27/2013 Elsevier Interactive Patient Education 2016 Elsevier Inc.  

## 2016-02-17 NOTE — Progress Notes (Signed)
By signing my name below I, Shelah Lewandowsky, attest that this documentation has been prepared under the direction and in the presence of Norberto Sorenson, MD. Electonically Signed. Shelah Lewandowsky, Scribe 02/17/2016 at 6:24 PM  Subjective:    Patient ID: Kathryn Ferguson, female    DOB: Jul 28, 1985, 30 y.o.   MRN: [id number]  Chief Complaint  Patient presents with  . Headache  . Dizziness    HPI Kathryn Ferguson is a 30 y.o. female who presents to the [facility] complaining of HA and dizziness.  Pt reports having a migraine 3.5 weeks ago. Pt states that she has HAs in the past and only one other migraine prior to 3.5 weeks ago. When pt was having her last migraine, she laid in bed all day feeling like she needed to sleep it off. Pt took ibuprofen with mild relief. Pt had mild nausea at that time. Pt denies any changes in her vision or photophobia. Migraine resolved after the first day. Since that time pt has had intermittent light HAs and feeling has been feeling "off" on intermittent days. Pt has been drinking a lot of water thinking HA was due to dehydration. Pt then thought she was hyponatremic after talking to a friend who had is an MD, so pt started drinking Gatorade and HAs resolved for 2 days. HAs returned today. Pt had labs drawn within the past 2 days and pt brought in a copy of her labs. Pt had Full CMP which was normal.  Pt states that dizziness is not room spinning dizziness, more lightheaded. Worse in the morning after she wakes up or with movement. Pt also reports getting SOB more easily with exertion. All symptoms have been within the past 3.5 weeks. BP normally in the 100s/60s and today was 120/80 which is high for her.   Denies any changes in hair, skin, energy levels, bowel movements. Pt also denies any fever, chills, sore throat, cough, or urinary symptoms. Pt is 4 months post partum. Pt has been getting plenty of sleep.  Pt uses condoms for birth control.  Pt denies family  history of migraines.   Patient Active Problem List   Diagnosis Date Noted  . Postpartum care following vaginal delivery (6/3) 10/22/2015  . Post-dates pregnancy 10/21/2015  . 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 SYNCOPE 04/20/2009  . Headache 04/20/2009    Current Outpatient Prescriptions on File Prior to Visit  Medication Sig Dispense Refill  . acetaminophen (TYLENOL) 325 MG tablet Take 650 mg by mouth every 6 (six) hours as needed for moderate pain.    Marland Kitchen ibuprofen (ADVIL,MOTRIN) 600 MG tablet Take 1 tablet (600 mg total) by mouth every 6 (six) hours. 30 tablet 0  . Prenatal Vit-Fe Fumarate-FA (PRENATAL MULTIVITAMIN) TABS Take 1 tablet by mouth daily.    . cyclobenzaprine (FLEXERIL) 5 MG tablet Take 1 tablet (5 mg total) by mouth 3 (three) times daily as needed for muscle spasms. (Patient not taking: Reported on 02/17/2016) 15 tablet 0   No current facility-administered medications on file prior to visit.     Allergies  Allergen Reactions  . Cefaclor Swelling    Joint swelling when 30 yrs old Has tolerated amoxicillin    Depression screen [facility] 2/9 02/17/2016  Decreased Interest 0  Down, Depressed, Hopeless 0  PHQ - 2 Score 0       Review of Systems  Constitutional: Negative for chills, fatigue and fever.  HENT: Negative for congestion.   Eyes: Negative for photophobia and  visual disturbance.  Respiratory: Negative for cough and shortness of breath.   Cardiovascular: Negative for chest pain.  Gastrointestinal: Negative for abdominal pain, constipation, diarrhea, nausea and vomiting.  Genitourinary: Negative.   Musculoskeletal: Negative.   Skin: Negative.   Neurological: Positive for light-headedness and headaches.  Psychiatric/Behavioral: Negative for sleep disturbance.       Objective:   Physical Exam  Constitutional: She is oriented to person, place, and time. She appears well-developed and well-nourished. No distress.  HENT:  Head: Normocephalic and atraumatic.  Eyes:  Conjunctivae are normal. Pupils are equal, round, and reactive to light.  Neck: Neck supple.  Cardiovascular: Normal rate, regular rhythm and normal heart sounds.  Exam reveals no gallop and no friction rub.   No murmur heard. Pulmonary/Chest: Effort normal and breath sounds normal. No accessory muscle usage. She has no decreased breath sounds. She has no wheezes. She has no rhonchi. She has no rales.  Abdominal: Normal appearance and bowel sounds are normal. She exhibits no distension and no mass. There is no hepatosplenomegaly. There is no tenderness. There is no rebound, no guarding and no CVA tenderness.  Musculoskeletal: Normal range of motion.  Neurological: She is alert and oriented to person, place, and time.  Skin: Skin is warm and dry.  Psychiatric: She has a normal mood and affect. Her behavior is normal.  Nursing note and vitals reviewed.  BP 122/80 (BP Location: Right Arm, Patient Position: Sitting, Cuff Size: Small)   Pulse 74   Temp 98.1 F (36.7 C) (Oral)   Resp 18   Ht 5\' 3"  (1.6 m)   Wt 133 lb (60.3 kg)   SpO2 99%   BMI 23.56 kg/m   Pt has borderline positive orthostatics and was symptomatic     Assessment & Plan:   1. Dizziness and giddiness   2. Acute nonintractable headache, unspecified headache type   3. Orthostatic hypotension    Pt wants to make sure she didn't develop empty sella syndrome but is lactating normally still (has a 31 yo, 31 yo, and 4 mo).  Unknown etiology - certainly maybe stress and fatigue of 2 children under 5 yo. Try to increase hydration, salt, and protein in diet. Increase iron in diet and watchful waiting. Consider keeping sx diary.  Orders Placed This Encounter  Procedures  . Sedimentation Rate  . Thyroid Panel With TSH  . Orthostatic vital signs  . POCT CBC  . POCT Microscopic Urinalysis ([facility])  . POCT urinalysis dipstick     I personally performed the services described in this documentation, which was scribed in my presence.  The recorded information has been reviewed and considered, and addended by me as needed.   Norberto SorensonEva Shaw, M.D.  [facility]  [place] [address] [place], [place] [address] [phone] phone [phone]) [phone] fax  02/20/16 1:37 PM

## 2016-02-18 LAB — THYROID PANEL WITH TSH
FREE THYROXINE INDEX: 2 (ref 1.4–3.8)
T3 Uptake: 34 % (ref 22–35)
T4, Total: 5.9 ug/dL (ref 4.5–12.0)
TSH: 2.35 m[IU]/L

## 2016-02-18 LAB — SEDIMENTATION RATE: Sed Rate: 4 mm/hr (ref 0–20)

## 2016-02-19 ENCOUNTER — Encounter: Payer: Self-pay | Admitting: Family Medicine

## 2016-12-12 ENCOUNTER — Encounter: Payer: Self-pay | Admitting: Family Medicine

## 2016-12-12 ENCOUNTER — Ambulatory Visit (INDEPENDENT_AMBULATORY_CARE_PROVIDER_SITE_OTHER): Payer: BLUE CROSS/BLUE SHIELD | Admitting: Family Medicine

## 2016-12-12 VITALS — BP 115/74 | HR 91 | Temp 98.3°F | Resp 18 | Ht 62.75 in | Wt 123.0 lb

## 2016-12-12 DIAGNOSIS — M7989 Other specified soft tissue disorders: Secondary | ICD-10-CM

## 2016-12-12 DIAGNOSIS — R2242 Localized swelling, mass and lump, left lower limb: Secondary | ICD-10-CM

## 2016-12-12 NOTE — Patient Instructions (Addendum)
     IF you received an x-ray today, you will receive an invoice from [facility]. Please contact [facility] at [phone] with questions or concerns regarding your invoice.   IF you received labwork today, you will receive an invoice from [facility]. Please contact [facility] at [phone] with questions or concerns regarding your invoice.   Our billing staff will not be able to assist you with questions regarding bills from these companies.  You will be contacted with the lab results as soon as they are available. The fastest way to get your results is to activate your My Chart account. Instructions are located on the last page of this paperwork. If you have not heard from us regarding the results in 2 weeks, please contact this office.     Lipoma A lipoma is a noncancerous (benign) tumor that is made up of fat cells. This is a very common type of soft-tissue growth. Lipomas are usually found under the skin (subcutaneous). They may occur in any tissue of the body that contains fat. Common areas for lipomas to appear include the back, shoulders, buttocks, and thighs. Lipomas grow slowly, and they are usually painless. Most lipomas do not cause problems and do not require treatment. What are the causes? The cause of this condition is not known. What increases the risk? This condition is more likely to develop in:  People who are 40-60 years old.  People who have a family history of lipomas.  What are the signs or symptoms? A lipoma usually appears as a small, round bump under the skin. It may feel soft or rubbery, but the firmness can vary. Most lipomas are not painful. However, a lipoma may become painful if it is located in an area where it pushes on nerves. How is this diagnosed? A lipoma can usually be diagnosed with a physical exam. You may also have tests to confirm the diagnosis and to rule out other conditions. Tests may include:  Imaging tests, such as a CT  scan or MRI.  Removal of a tissue sample to be looked at under a microscope (biopsy).  How is this treated? Treatment is not needed for small lipomas that are not causing problems. If a lipoma continues to get bigger or it causes problems, removal is often the best option. Lipomas can also be removed to improve appearance. Removal of a lipoma is usually done with a surgery in which the fatty cells and the surrounding capsule are removed. Most often, a medicine that numbs the area (local anesthetic) is used for this procedure. Follow these instructions at home:  Keep all follow-up visits as directed by your health care provider. This is important. Contact a health care provider if:  Your lipoma becomes larger or hard.  Your lipoma becomes painful, red, or increasingly swollen. These could be signs of infection or a more serious condition. This information is not intended to replace advice given to you by your health care provider. Make sure you discuss any questions you have with your health care provider. Document Released: 04/27/2002 Document Revised: 10/13/2015 Document Reviewed: 05/03/2014 Elsevier Interactive Patient Education  2018 Elsevier Inc.  

## 2016-12-12 NOTE — Progress Notes (Addendum)
By signing my name below, I, Mesha Guinyard, attest that this documentation has been prepared under the direction and in the presence of Norberto SorensonEva Adelle Zachar, MD.  Electronically Signed: Arvilla MarketMesha Guinyard, Medical Scribe. 12/12/16. 5:41 PM.  Subjective:    Patient ID: Kathryn Ferguson, female    DOB: 05/24/1985, 31 y.o.   MRN: [id number]  HPI Chief Complaint  Patient presents with  . Other    L lower leg thump x3119month. Not painful stated by patient but just is different feeling than the other     HPI Comments: Kathryn Ferguson is a 31 y.o. female who [facility] complaining of nonpainful lump on her left calf onset a month ago. Pt felt it more when she's flexing and reports it's slightly sensitive, but non-painful. She also felt it while biking yesterday. Denies swelling in lower extremities, and erythema.  Patient Active Problem List   Diagnosis Date Noted  . Postpartum care following vaginal delivery (6/3) 10/22/2015  . Post-dates pregnancy 10/21/2015  . 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 SYNCOPE 04/20/2009  . Headache 04/20/2009   Past Medical History:  Diagnosis Date  . Abnormal Pap smear   . Dyspareunia 04/20/2009   Qualifier: Diagnosis of  By: Wendee CoppFiato, CMA, Carol    . Gestational thrombocytopenia (HCC) 01/04/2012  . H/O varicella   . HPV (human papilloma virus) anogenital infection   . Nonspecific abnormal finding in amniotic fluid    polyhydramnios  . Normal labor 01/03/2012  . Other specified fetal and placental problems affecting management of mother, unspecified as to episode of care    sch  . Postpartum care following vaginal delivery (3/10) 07/28/2013  . Postpartum care following vaginal delivery (8/16) 01/04/2012  . Vaginal Pap smear, abnormal    Past Surgical History:  Procedure Laterality Date  . NO PAST SURGERIES     Allergies  Allergen Reactions  . Cefaclor Swelling    Joint swelling when 31 yrs old Has tolerated amoxicillin   Prior to Admission medications   Medication  Sig Start Date End Date Taking? Authorizing Provider  acetaminophen (TYLENOL) 325 MG tablet Take 650 mg by mouth every 6 (six) hours as needed for moderate pain.   Yes [provider]  ibuprofen (ADVIL,MOTRIN) 600 MG tablet Take 1 tablet (600 mg total) by mouth every 6 (six) hours. 10/23/15  Yes Marlinda MikeBailey, Tanya, CNM  Prenatal Vit-Fe Fumarate-FA (PRENATAL MULTIVITAMIN) TABS Take 1 tablet by mouth daily.   Yes [provider]   Social History   Social History  . Marital status: Married    Spouse name: Laray AngerRyan Olazabal  . Number of children: 2  . Years of education: College   Occupational History  . homemaker    Social History Main Topics  . Smoking status: Never Smoker  . Smokeless tobacco: Never Used  . Alcohol use 1.2 oz/week    2 Glasses of wine per week  . Drug use: No  . Sexual activity: Yes    Partners: Male    Birth control/ protection: Condom   Other Topics Concern  . Not on file   Social History Narrative   Lives with her husband and their 2 children.   Her in-laws live near-by.   Review of Systems  Cardiovascular: Negative for leg swelling.  Musculoskeletal:       Positive for nodule  Skin: Negative for color change.    Objective:  Physical Exam  Constitutional: She appears well-developed and well-nourished. No distress.  HENT:  Head: Normocephalic  and atraumatic.  Eyes: Conjunctivae are normal.  Neck: Neck supple.  Cardiovascular: Normal rate.   Pulses:      Dorsalis pedis pulses are 2+ on the right side, and 2+ on the left side.       Posterior tibial pulses are 2+ on the right side, and 2+ on the left side.  Pulmonary/Chest: Effort normal.  Musculoskeletal: She exhibits edema (No lower extremity).  1 cm deep tissue nodule poorly defined The lateral aspect on the left mid calf Left and right calf 13 min diameter No clonus Negative homans sign  Neurological: She is alert.  Skin: Skin is warm and dry.  Psychiatric: She has a normal mood and  affect. Her behavior is normal.  Nursing note and vitals reviewed.   Vitals:   12/12/16 1735  BP: 115/74  Pulse: 91  Resp: 18  Temp: 98.3 F (36.8 C)  TempSrc: Oral  SpO2: 96%  Weight: 123 lb (55.8 kg)  Height: 5' 2.75" (1.594 m)   Body mass index is 21.96 kg/m. Assessment & Plan:   1. Mass of soft tissue of left lower extremity     Orders Placed This Encounter  Procedures  . US LT LOWER EXTREM LTD SOFT TISSUE NON VASCULAR    Epic order Wt-123/no needs Ins-bcbs am[facility],pt    Standing Status:   Future    Number of Occurrences:   1    Standing Expiration Date:   02/12/2018    Order Specific Question:   Reason for Exam (SYMPTOM  OR DIAGNOSIS REQUIRED)    Answer:   poorly defined mildly tender soft tissue mass approx 1 x 2cm in mid lateral calf    Order Specific Question:   Preferred imaging location?    Answer:   [facility]   ADDENDUM: US did not show any abnml at all in the place of the small subtle yet palpable mass. Reassurance provided that highly unlikely to be a harmful or malignant mass since indistinct with nml tissue. DDx: lipoma, fibroma, deep scar-type rxn from unremembered prior injury, oddly placed lymphnode. However, pt still has the concern for the remote poss malignancy so agreed to referral to Gen Surg for second opinion. If needed perhaps they could do FNA to show benign tissue?  Or consider MRI?  I personally performed the services described in this documentation, which was scribed in my presence. The recorded information has been reviewed and considered, and addended by me as needed.   Norberto SorensonEva Tayten Bergdoll, M.D.  Primary Care at [facility]  [place] [address] [place], [place] [id number] [phone] phone [phone]) [phone] fax  12/14/16 11:57 PM

## 2016-12-14 ENCOUNTER — Telehealth: Payer: Self-pay | Admitting: Family Medicine

## 2016-12-14 ENCOUNTER — Encounter: Payer: Self-pay | Admitting: Family Medicine

## 2016-12-14 ENCOUNTER — Ambulatory Visit
Admission: RE | Admit: 2016-12-14 | Discharge: 2016-12-14 | Disposition: A | Discharge status: Home / Self Care | Payer: BLUE CROSS/BLUE SHIELD | Source: Ambulatory Visit | Attending: Family Medicine | Admitting: Family Medicine

## 2016-12-14 DIAGNOSIS — R2242 Localized swelling, mass and lump, left lower limb: Principal | ICD-10-CM

## 2016-12-14 DIAGNOSIS — M7989 Other specified soft tissue disorders: Secondary | ICD-10-CM

## 2016-12-14 NOTE — Telephone Encounter (Signed)
Pt would like a call back when ultrasound results come in..  Please advise: [phone]: [phone]

## 2016-12-20 NOTE — Telephone Encounter (Signed)
IC pt and advised findings - WNL Pt wanted to know if Dr. Clelia CroftShaw saw results. Sending to Dr. Clelia CroftShaw for review

## 2016-12-21 NOTE — Addendum Note (Signed)
Addended by: Sherren MochaSHAW, EVA N on: 12/21/2016 12:55 PM   Modules accepted: Orders

## 2016-12-21 NOTE — Telephone Encounter (Signed)
Called pt. Reassurance provided. Will refer to Gen Surg for a second opinion.

## 2017-06-04 ENCOUNTER — Other Ambulatory Visit: Payer: Self-pay | Admitting: Family Medicine

## 2017-06-04 DIAGNOSIS — R2242 Localized swelling, mass and lump, left lower limb: Secondary | ICD-10-CM

## 2017-06-07 ENCOUNTER — Ambulatory Visit
Admission: RE | Admit: 2017-06-07 | Discharge: 2017-06-07 | Disposition: A | Discharge status: Home / Self Care | Payer: BLUE CROSS/BLUE SHIELD | Source: Ambulatory Visit | Attending: Family Medicine | Admitting: Family Medicine

## 2017-06-07 DIAGNOSIS — R2242 Localized swelling, mass and lump, left lower limb: Secondary | ICD-10-CM

## 2019-12-22 ENCOUNTER — Other Ambulatory Visit: Payer: Self-pay

## 2019-12-22 DIAGNOSIS — N6320 Unspecified lump in the left breast, unspecified quadrant: Secondary | ICD-10-CM

## 2019-12-23 ENCOUNTER — Other Ambulatory Visit: Payer: Self-pay | Admitting: Obstetrics and Gynecology

## 2019-12-25 ENCOUNTER — Other Ambulatory Visit: Payer: Self-pay

## 2019-12-25 ENCOUNTER — Ambulatory Visit
Admission: RE | Admit: 2019-12-25 | Discharge: 2019-12-25 | Disposition: A | Discharge status: Home / Self Care | Payer: No Typology Code available for payment source | Source: Ambulatory Visit

## 2019-12-25 DIAGNOSIS — N6001 Solitary cyst of right breast: Secondary | ICD-10-CM

## 2019-12-25 DIAGNOSIS — N6320 Unspecified lump in the left breast, unspecified quadrant: Secondary | ICD-10-CM

## 2020-01-01 ENCOUNTER — Ambulatory Visit
Admission: RE | Admit: 2020-01-01 | Discharge: 2020-01-01 | Disposition: A | Discharge status: Home / Self Care | Payer: No Typology Code available for payment source | Source: Ambulatory Visit

## 2020-01-01 ENCOUNTER — Other Ambulatory Visit: Payer: Self-pay

## 2020-01-01 DIAGNOSIS — N6001 Solitary cyst of right breast: Secondary | ICD-10-CM

## 2020-10-18 IMAGING — US US  BREAST BX W/ LOC DEV 1ST LESION IMG BX SPEC US GUIDE*R*
1 series · 10 of 10 positions shown · non-contrast
Comparison: Previous exam(s).
COMPARISON: Previous exam(s).

Addendum:
CLINICAL DATA: Right breast mass.

EXAM:
ULTRASOUND GUIDED RIGHT BREAST CORE NEEDLE BIOPSY

[Series 1: us breast bx w/ loc dev 1st lesion img bx spec us  · 0.06mm/px · 10 of 10 slices shown]
[im 1/10]
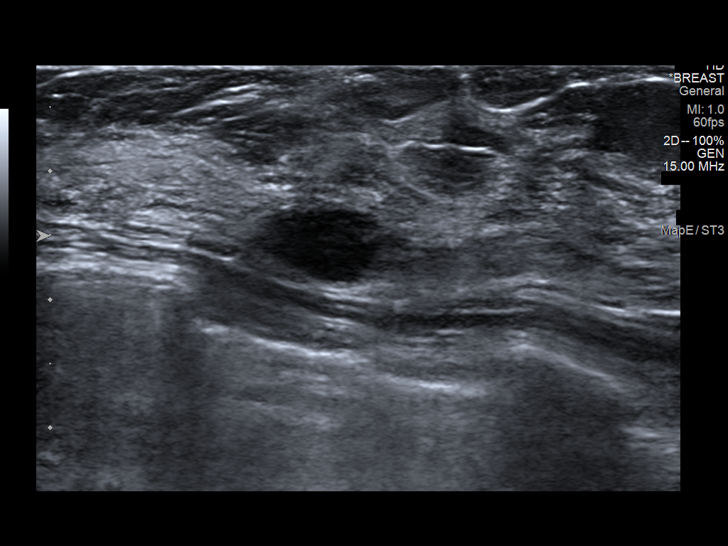
[im 2/10]
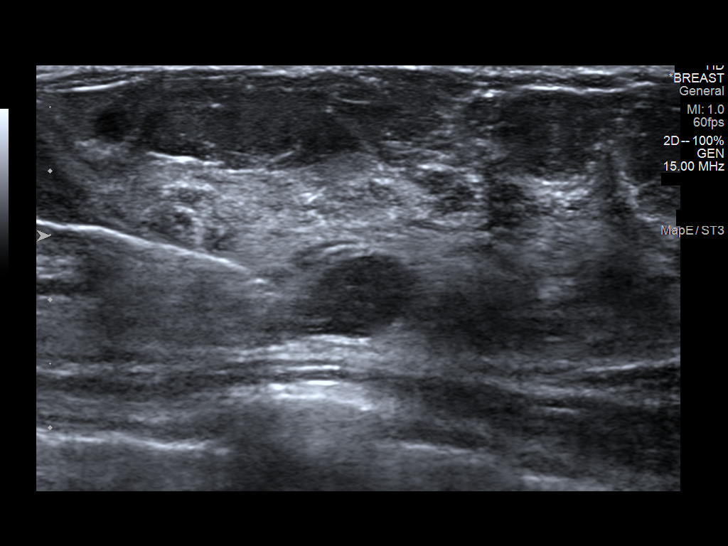
[im 3/10]
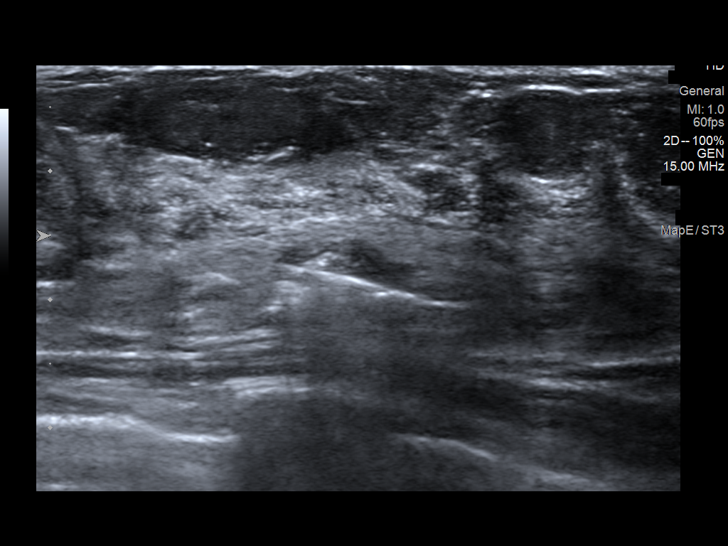
[im 4/10]
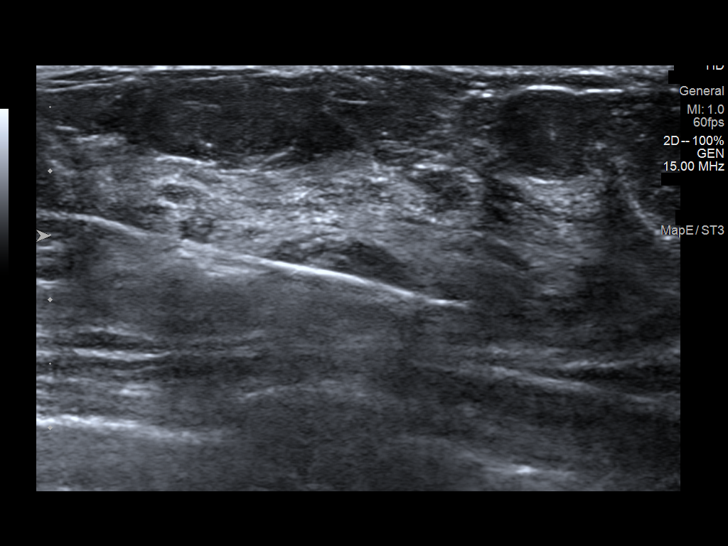
[im 5/10]
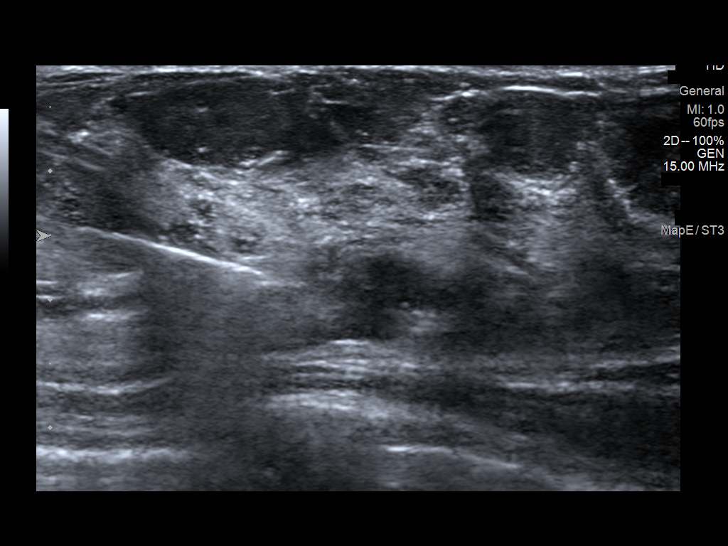
[im 6/10]
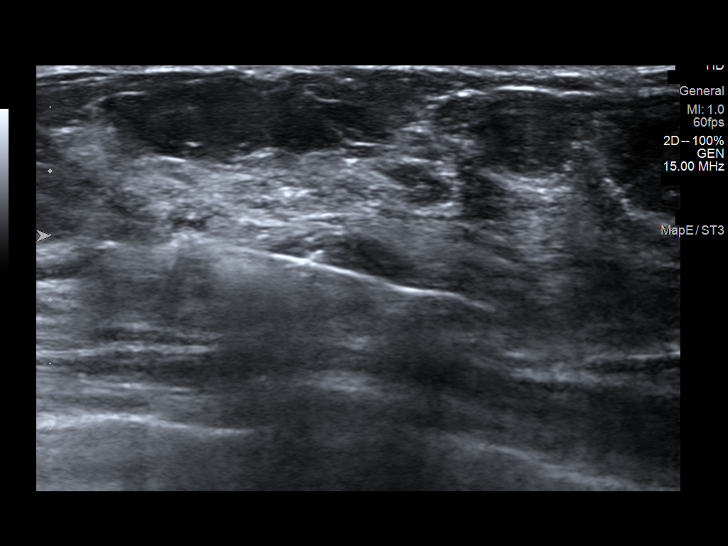
[im 7/10]
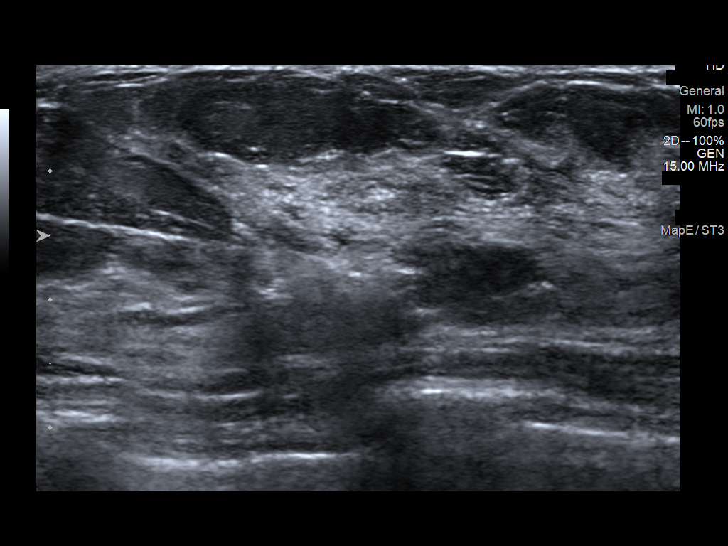
[im 8/10]
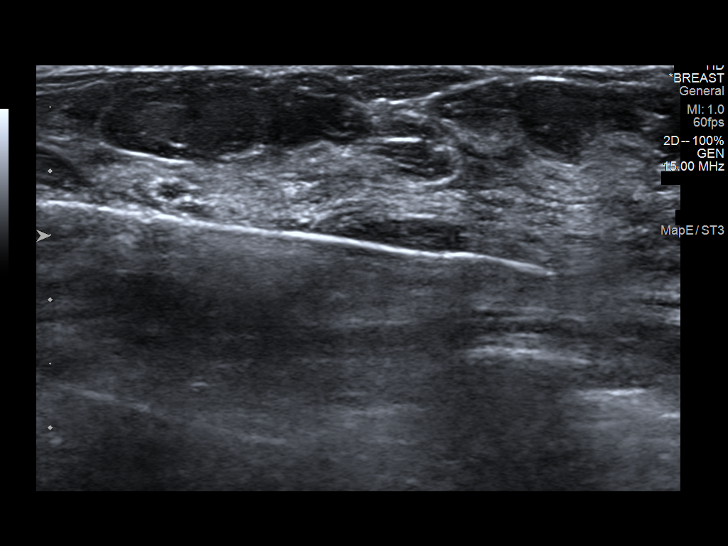
[im 9/10]
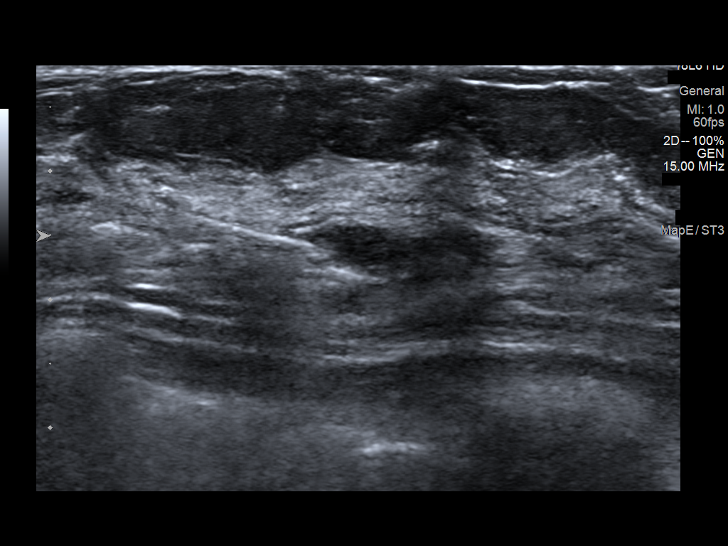
[im 10/10]
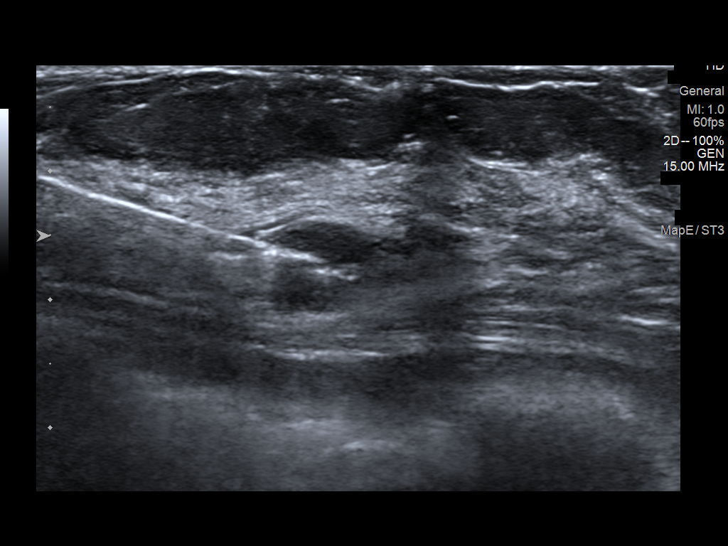

[10 of 10 positions shown; findings below may reference images not displayed]



Lesion quadrant: 9 o'clock

Using sterile technique and 1% lidocaine and 1% lidocaine with
epinephrine as local anesthetic, under direct ultrasound
visualization, a 14 gauge Semeda device was used to perform
biopsy of a mass in the 9 o'clock region of the right breast using a
lateral to medial approach. At the conclusion of the procedure
ribbon shaped tissue marker clip was deployed into the biopsy
cavity. Follow up 2 view mammogram was performed and dictated
separately.
IMPRESSION: Ultrasound guided biopsy of the right breast. No apparent
complications.

ADDENDUM:
Pathology revealed FIBROADENOMA WITH CALCIFICATIONS of the RIGHT
breast, 9 o'clock. This was found to be concordant by Dr. Aujla
Finesa.

Pathology results were discussed with the patient by telephone. The
patient reported doing well after the biopsy with tenderness at the
site. Post biopsy instructions and care were reviewed and questions
were answered. The patient was encouraged to call The [REDACTED]

The patient was instructed to continue with monthly self breast
examinations, clinical follow-up as needed, and to return for annual
mammography at 39. The patient was informed a reminder notice would
be sent regarding this appointment.

Pathology results reported by Mansoor Antoine RN on 01/04/2020.



Lesion quadrant: 9 o'clock

Using sterile technique and 1% lidocaine and 1% lidocaine with
epinephrine as local anesthetic, under direct ultrasound
visualization, a 14 gauge Semeda device was used to perform
biopsy of a mass in the 9 o'clock region of the right breast using a
lateral to medial approach. At the conclusion of the procedure
ribbon shaped tissue marker clip was deployed into the biopsy
cavity. Follow up 2 view mammogram was performed and dictated
separately.
IMPRESSION: Ultrasound guided biopsy of the right breast. No apparent
complications.

## 2020-10-18 IMAGING — MG MM BREAST LOCALIZATION CLIP
4 series · 4 of 12 positions shown · non-contrast
Comparison: Previous exam(s).

CLINICAL DATA: Status post ultrasound-guided core biopsy a right
breast mass.

EXAM:
3D DIAGNOSTIC RIGHT MAMMOGRAM POST ULTRASOUND BIOPSY

[R CC synth-2D]
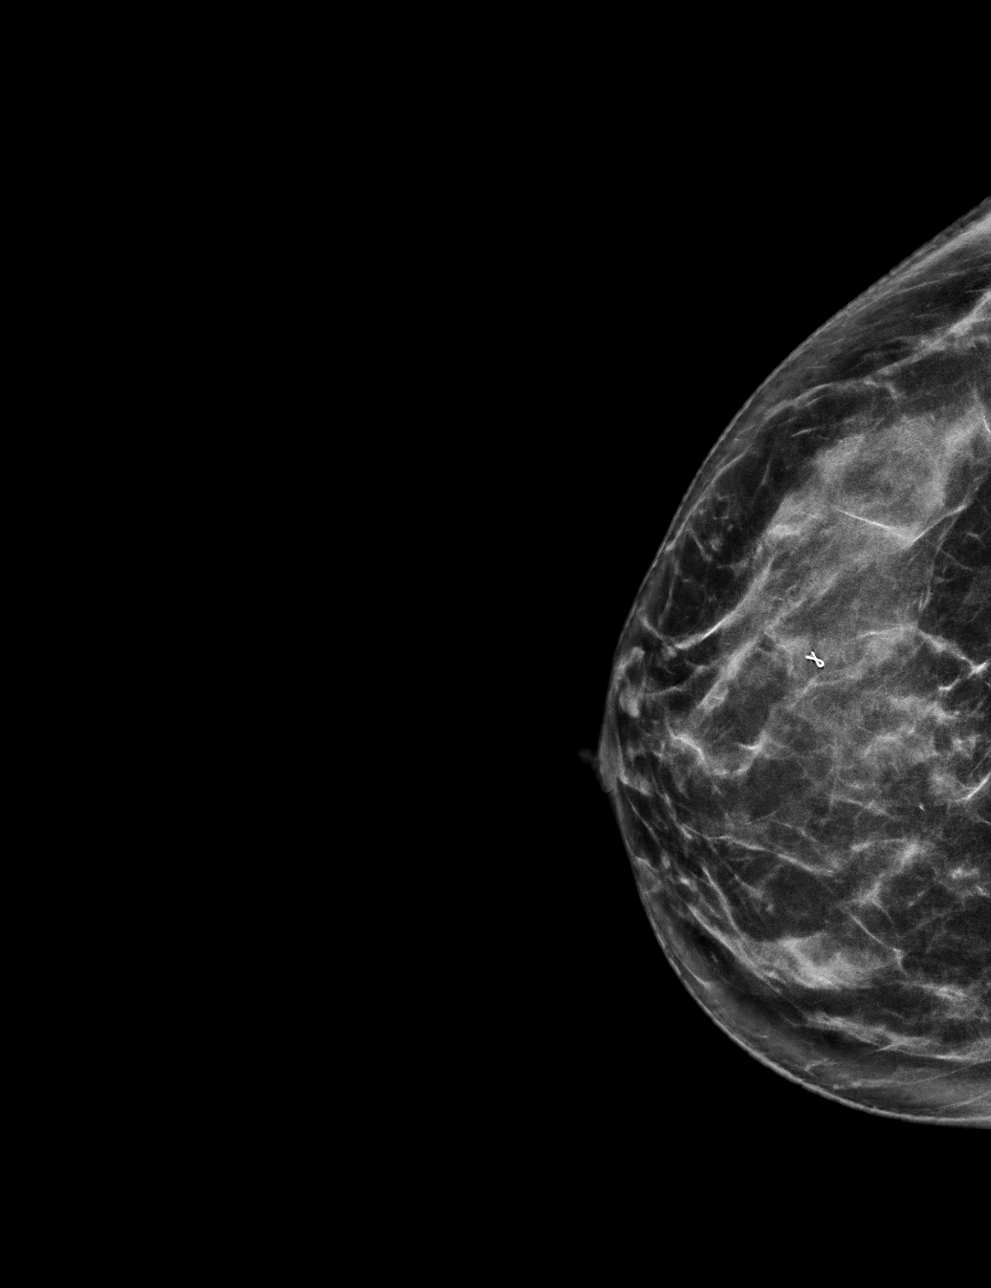

[R ML synth-2D]
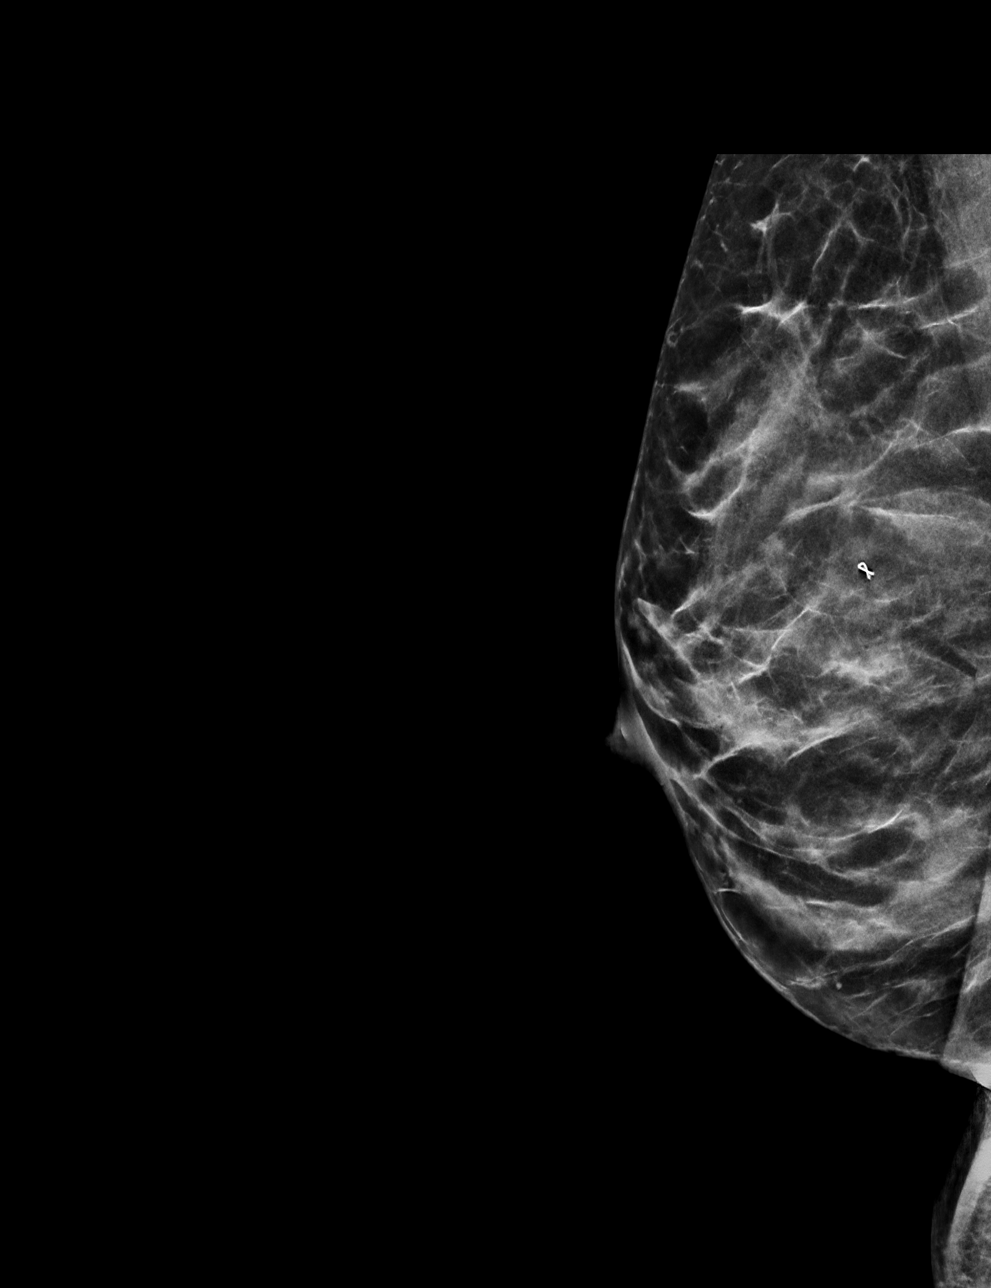

[R CC tomo · tomo slice 30/59.0]
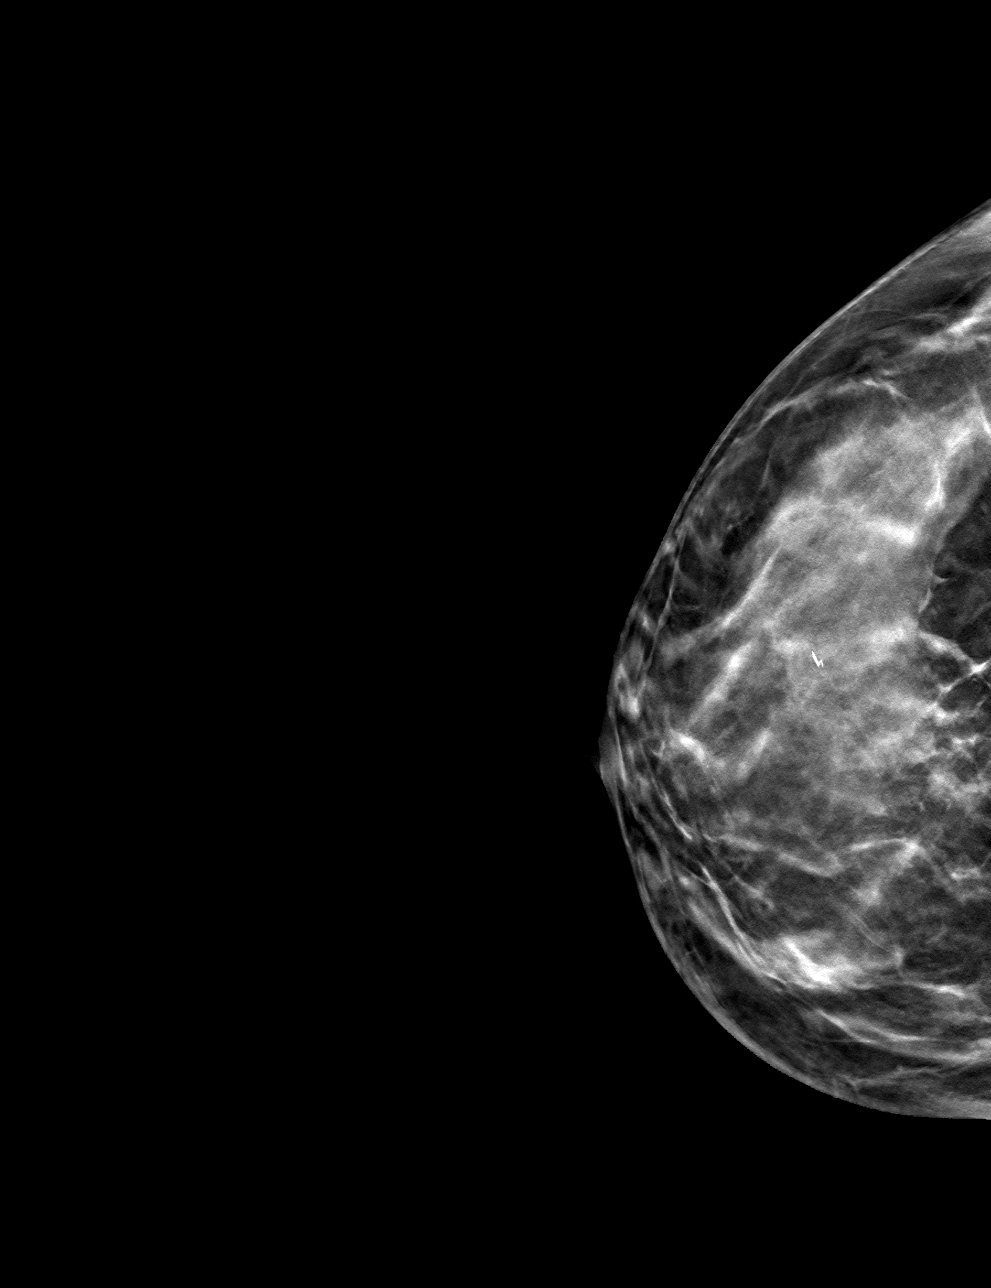

[R ML tomo · tomo slice 27/52.0]
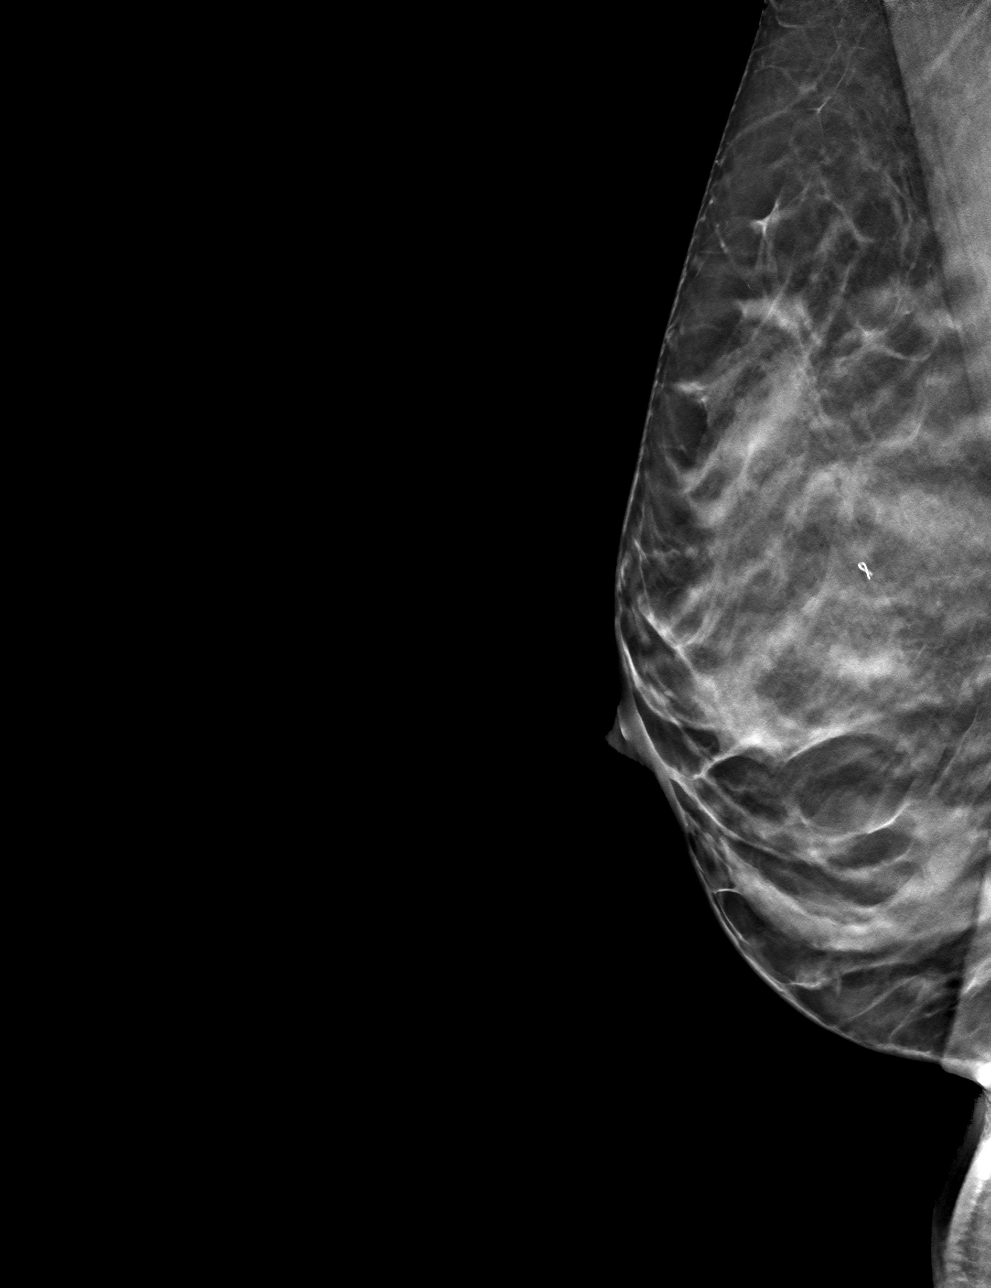

[4 of 12 positions shown; findings below may reference images not displayed]

FINDINGS: 3D Mammographic images were obtained following ultrasound guided
biopsy of a mass in the 9 o'clock region of the right breast. The
biopsy marking clip is in the expected location in the lateral
aspect of the right breast.
IMPRESSION: Appropriate positioning of the ribbon shaped biopsy marking clip at
the site of biopsy in the lateral aspect of the right breast.

Final Assessment: Post Procedure Mammograms for Marker Placement

## 2021-10-03 ENCOUNTER — Encounter: Payer: Self-pay | Admitting: Neurology

## 2021-12-31 NOTE — Progress Notes (Signed)
NEUROLOGY CONSULTATION NOTE  Kathryn Ferguson MRN: [id number] DOB: 12/26/1984  Referring provider: Nadyne Coombes, MD   Reason for consult:  migraines  Assessment/Plan:   Migraine without aura, without status migrainosus, not intractable  Migraine prevention:  She will try lifestyle modification with supplements (magnesium citrate, CoQ10, riboflavin) first.  If ineffective, will start a preventative Migraine rescue:  tizanidine 4mg  at earliest onset of neck tightness (cautioned for drowsiness), sumatriptan 100mg  earliest onset of headache Limit use of pain relievers to no more than 2 days out of week to prevent risk of rebound or medication-overuse headache. Keep headache diary Follow up 4 months    Subjective:  Kathryn Ferguson is a 36 year old female who presents for migraines.  History supplemented by referring provider's note.  Onset:  Worse following birth of her youngest child in 2018 Location:  left sided - retroorbital radiating to back of head and sometimes back of neck (neck tightness often precedes onset of headache) Quality:  sometimes constant pain, other headaches are throbbing Intensity:  5/10 to 9/10.   Aura:  absent Prodrome:  absent Associated symptoms:  Mild photophobia and phonophobia.  She denies associated nausea, vomiting, visual disturbance,unilateral numbness or weakness. Duration:  24-48 hours.  Often wakes up with them Frequency:  2 times a week on average (2-4 days a week), about 2 times a month cannot function Triggers:  Going to bed late (after 11 PM), out in sun all day, contact lenses, hormonal, caffeine withdrawal, stress; neck movements may aggravate it. Relieving factors:  ibuprofen (variable efficacy), sleep, ice packs Activity:  severe aggravates Frequency of ibuprofen - 2-4 days a week  Past NSAIDS/analgesics:  Tylenol, Excedrin Past abortive triptans:  none Past abortive ergotamine:  none Past muscle relaxants:  Flexeril Past anti-emetic:   none Past antihypertensive medications:  none Past antidepressant medications:  none Past anticonvulsant medications:  none Past anti-CGRP:  Nurtec (rescue - ineffective) Past vitamins/Herbal/Supplements:  magnesium, riboflavin, CoQ10 Past antihistamines/decongestants:  none Other past therapies:  none  Current NSAIDS/analgesics:  ibuprofen 600mg  Current triptans:  none Current ergotamine:  none Current anti-emetic:  none Current muscle relaxants:  none Current Antihypertensive medications:  none Current Antidepressant medications:  none Current Anticonvulsant medications:  none Current anti-CGRP:  none Current Vitamins/Herbal/Supplements:  none Current Antihistamines/Decongestants:  none Other therapy:  none Hormone/birth control:  none    Caffeine:  1 cup of coffee in AM, 1 cup coffee or tea in afternoon Alcohol:  little alcohol Smoker:  no Diet:  No soda.  Drinks a lot of water.  Does not skip meals Exercise:  not routine Depression:  no; Anxiety:  some Other pain:  ovarian cyst Sleep hygiene:  okay.  No less than 7 hours a night Family history of headache:  no      PAST MEDICAL HISTORY: Past Medical History:  Diagnosis Date   Abnormal Pap smear    Dyspareunia 04/20/2009   Qualifier: Diagnosis of  By: 2019, CMA, Carol     Gestational thrombocytopenia (HCC) 01/04/2012   H/O varicella    HPV (human papilloma virus) anogenital infection    Nonspecific abnormal finding in amniotic fluid    polyhydramnios   Normal labor 01/03/2012   Other specified fetal and placental problems affecting management of mother, unspecified as to episode of care    sch   Postpartum care following vaginal delivery (3/10) 07/28/2013   Postpartum care following vaginal delivery (8/16) 01/04/2012   Vaginal Pap smear, abnormal     PAST  SURGICAL HISTORY: Past Surgical History:  Procedure Laterality Date   NO PAST SURGERIES      MEDICATIONS: Current Outpatient Medications on File Prior  to Visit  Medication Sig Dispense Refill   ibuprofen (ADVIL,MOTRIN) 600 MG tablet Take 1 tablet (600 mg total) by mouth every 6 (six) hours. 30 tablet 0   No current facility-administered medications on file prior to visit.     ALLERGIES: Allergies  Allergen Reactions   Cefaclor Swelling    Joint swelling when 36 yrs old Has tolerated amoxicillin    FAMILY HISTORY: Family History  Problem Relation Age of Onset   Thyroid disease Mother    Nephrolithiasis Mother    Heart attack Maternal Aunt    Nephrolithiasis Maternal Grandfather    Heart disease Maternal Grandfather    Cancer Paternal Grandfather        brain   Hemophilia Paternal Grandfather    Heart disease Maternal Grandmother     Objective:  Blood pressure 117/78, pulse 80, height 5\' 3"  (1.6 m), weight 129 lb 12.8 oz (58.9 kg), SpO2 100 %, currently breastfeeding. General: No acute distress.  Patient appears well-groomed.   Head:  Normocephalic/atraumatic Eyes:  fundi examined but not visualized Neck: supple, no paraspinal tenderness, full range of motion Heart: regular rate and rhythm Neurological Exam: Mental status: alert and oriented to person, place, and time, speech fluent and not dysarthric, language intact. Cranial nerves: CN I: not tested CN II: pupils equal, round and reactive to light, visual fields intact CN III, IV, VI:  full range of motion, no nystagmus, no ptosis CN V: facial sensation intact. CN VII: upper and lower face symmetric CN VIII: hearing intact CN IX, X: gag intact, uvula midline CN XI: sternocleidomastoid and trapezius muscles intact CN XII: tongue midline Bulk & Tone: normal, no fasciculations. Motor:  muscle strength 5/5 throughout Sensation:  Pinprick, temperature and vibratory sensation intact. Deep Tendon Reflexes:  2+ throughout,  toes downgoing.   Finger to nose testing:  Without dysmetria.   Heel to shin:  Without dysmetria.   Gait:  Normal station and stride.  Romberg  negative.    Thank you for allowing me to take part in the care of this patient.  , DO  CC: Shon Millet, MD

## 2022-01-01 ENCOUNTER — Encounter: Payer: Self-pay | Admitting: Neurology

## 2022-01-01 ENCOUNTER — Ambulatory Visit: Payer: No Typology Code available for payment source | Admitting: Neurology

## 2022-01-01 VITALS — BP 117/78 | HR 80 | Ht 63.0 in | Wt 129.8 lb

## 2022-01-01 DIAGNOSIS — G43009 Migraine without aura, not intractable, without status migrainosus: Secondary | ICD-10-CM

## 2022-01-01 MED ORDER — SUMATRIPTAN SUCCINATE 100 MG PO TABS: ORAL_TABLET | ORAL | 5 refills | Status: AC

## 2022-01-01 MED ORDER — TIZANIDINE HCL 4 MG PO TABS: 4.0000 mg | ORAL_TABLET | Freq: Four times a day (QID) | ORAL | 5 refills | Status: AC | PRN

## 2022-01-01 NOTE — Patient Instructions (Signed)
  Try lifestyle modification as recommended below, as well as the supplements listed below (may use Migrelief as well. Take tizanidine 4mg  at earliest onset of neck tension (preceding the headache) Take sumatriptan 100mg  at earliest onset of headache.  May repeat dose once in 2 hours if needed.  Maximum 2 tablets in 24 hours. Limit use of pain relievers to no more than 2 days out of the week.  These medications include acetaminophen, NSAIDs (ibuprofen/Advil/Motrin, naproxen/Aleve, triptans (Imitrex/sumatriptan), Excedrin, and narcotics.  This will help reduce risk of rebound headaches. Be aware of common food triggers:  - Caffeine:  coffee, black tea, cola, Mt. Dew  - Chocolate  - Dairy:  aged cheeses (brie, blue, cheddar, gouda, [place], provolone, [place], Swiss, etc), chocolate milk, buttermilk, sour cream, limit eggs and yogurt  - Nuts, peanut butter  - Alcohol  - Cereals/grains:  FRESH breads (fresh bagels, sourdough, doughnuts), yeast productions  - Processed/canned/aged/cured meats (pre-packaged deli meats, hotdogs)  - MSG/glutamate:  soy sauce, flavor enhancer, pickled/preserved/marinated foods  - Sweeteners:  aspartame (Equal, Nutrasweet).  Sugar and Splenda are okay  - Vegetables:  legumes (lima beans, lentils, snow peas, fava beans, pinto peans, peas, garbanzo beans), sauerkraut, onions, olives, pickles  - Fruit:  avocados, bananas, citrus fruit (orange, lemon, grapefruit), mango  - Other:  Frozen meals, macaroni and cheese Routine exercise Stay adequately hydrated (aim for 64 oz water daily) Keep headache diary Maintain proper stress management Maintain proper sleep hygiene Do not skip meals Consider supplements:  magnesium citrate 400mg  daily, riboflavin 400mg  daily, coenzyme Q10 100mg  three times daily.

## 2022-04-08 ENCOUNTER — Emergency Department (HOSPITAL_COMMUNITY): Payer: No Typology Code available for payment source

## 2022-04-08 ENCOUNTER — Emergency Department (HOSPITAL_COMMUNITY)
Admission: EM | Admit: 2022-04-08 | Discharge: 2022-04-08 | Disposition: A | Discharge status: Home / Self Care | Payer: No Typology Code available for payment source | Attending: Emergency Medicine | Admitting: Emergency Medicine

## 2022-04-08 ENCOUNTER — Other Ambulatory Visit: Payer: Self-pay

## 2022-04-08 DIAGNOSIS — R102 Pelvic and perineal pain: Secondary | ICD-10-CM | POA: Diagnosis not present

## 2022-04-08 DIAGNOSIS — R109 Unspecified abdominal pain: Secondary | ICD-10-CM | POA: Diagnosis present

## 2022-04-08 DIAGNOSIS — R42 Dizziness and giddiness: Secondary | ICD-10-CM | POA: Insufficient documentation

## 2022-04-08 DIAGNOSIS — M545 Low back pain, unspecified: Secondary | ICD-10-CM | POA: Diagnosis not present

## 2022-04-08 LAB — COMPREHENSIVE METABOLIC PANEL
ALT: 8 U/L (ref 0–44)
AST: 18 U/L (ref 15–41)
Albumin: 4 g/dL (ref 3.5–5.0)
Alkaline Phosphatase: 38 U/L (ref 38–126)
Anion gap: 9 (ref 5–15)
BUN: 7 mg/dL (ref 6–20)
CO2: 24 mmol/L (ref 22–32)
Calcium: 8.7 mg/dL — ABNORMAL LOW (ref 8.9–10.3)
Chloride: 104 mmol/L (ref 98–111)
Creatinine, Ser: 0.9 mg/dL (ref 0.44–1.00)
GFR, Estimated: >60 mL/min (ref >60)
Glucose, Bld: 104 mg/dL — ABNORMAL HIGH (ref 70–99)
Potassium: 3.5 mmol/L (ref 3.5–5.1)
Sodium: 137 mmol/L (ref 135–145)
Total Bilirubin: 0.5 mg/dL (ref 0.3–1.2)
Total Protein: 6.4 g/dL — ABNORMAL LOW (ref 6.5–8.1)

## 2022-04-08 LAB — LIPASE, BLOOD: Lipase: 34 U/L (ref 11–51)

## 2022-04-08 LAB — TYPE AND SCREEN
ABO/RH(D): O POS
Antibody Screen: NEGATIVE

## 2022-04-08 LAB — CBC WITH DIFFERENTIAL/PLATELET
Abs Immature Granulocytes: 0.03 10*3/uL (ref 0.00–0.07)
Basophils Absolute: 0 10*3/uL (ref 0.0–0.1)
Basophils Relative: 0 %
Eosinophils Absolute: 0 10*3/uL (ref 0.0–0.5)
Eosinophils Relative: 0 %
HCT: 34.8 % — ABNORMAL LOW (ref 36.0–46.0)
Hemoglobin: 11.8 g/dL — ABNORMAL LOW (ref 12.0–15.0)
Immature Granulocytes: 0 %
Lymphocytes Relative: 12 %
Lymphs Abs: 1.1 10*3/uL (ref 0.7–4.0)
MCH: 29.6 pg (ref 26.0–34.0)
MCHC: 33.9 g/dL (ref 30.0–36.0)
MCV: 87.4 fL (ref 80.0–100.0)
Monocytes Absolute: 0.7 10*3/uL (ref 0.1–1.0)
Monocytes Relative: 7 %
Neutro Abs: 7.7 10*3/uL (ref 1.7–7.7)
Neutrophils Relative %: 81 %
Platelets: 200 10*3/uL (ref 150–400)
RBC: 3.98 MIL/uL (ref 3.87–5.11)
RDW: 12.5 % (ref 11.5–15.5)
WBC: 9.5 10*3/uL (ref 4.0–10.5)
nRBC: 0 % (ref 0.0–0.2)

## 2022-04-08 LAB — URINALYSIS, ROUTINE W REFLEX MICROSCOPIC
Bilirubin Urine: NEGATIVE
Glucose, UA: NEGATIVE mg/dL
Ketones, ur: NEGATIVE mg/dL
Leukocytes,Ua: NEGATIVE
Nitrite: NEGATIVE
Protein, ur: 30 mg/dL — AB
RBC / HPF: >50 RBC/hpf — ABNORMAL HIGH (ref 0–5)
Specific Gravity, Urine: 1.012 (ref 1.005–1.030)
pH: 7 (ref 5.0–8.0)

## 2022-04-08 LAB — I-STAT BETA HCG BLOOD, ED (MC, WL, AP ONLY): I-stat hCG, quantitative: <5 m[IU]/mL (ref <5)

## 2022-04-08 MED ORDER — LACTATED RINGERS IV BOLUS
1000.0000 mL | Freq: Once | INTRAVENOUS | Status: AC
  Administered 2022-04-08: 1000 mL via INTRAVENOUS

## 2022-04-08 NOTE — ED Notes (Signed)
Patient transported to us 

## 2022-04-08 NOTE — ED Provider Triage Note (Signed)
  Emergency Medicine Provider Triage Evaluation Note  MRN:  [id number]  Arrival date & time: 04/08/22    Medically screening exam initiated at 6:30 AM.   CC:   Abdominal Pain   HPI:  Kathryn Ferguson is a 36 y.o. year-old female presents to the ED with chief complaint of lower abdominal cramping.  States that it feels like menstrual cramps but worse.  BIB EMS.  Noted to by hypotensive.  States she has slight anemia.  Hx of ovarian cyst.  States bleeding is mild to moderate.  History provided by patient. ROS:  -As included in HPI PE:   Vitals:   04/08/22 0626  BP: 98/61  Pulse: 65  Resp: 16  Temp: 97.6 F (36.4 C)  SpO2: 100%    Non-toxic appearing No respiratory distress  MDM:  Abdominal pain.  Will check [place] to r/o torsion.  Increased risk due to cyst.  Will give fluids and check labs.  Charge RN notified of patient's BP. I've ordered labs and imaging in triage to expedite lab/diagnostic workup.  Patient was informed that the remainder of the evaluation will be completed by another provider, this initial triage assessment does not replace that evaluation, and the importance of remaining in the ED until their evaluation is complete.    Roxy Horseman, PA-C 04/08/22 [phone]

## 2022-04-08 NOTE — ED Triage Notes (Signed)
Pt arrives via [facility] from home. Per report, the pt was c/o period cramps, taking ibuprofen without relief. Pt c/o dizziness. BP en route 70/40 standing, 80/50 sitting. IV established, ns, repeat vitals 98/50. Hr 60's. CBG 149. Hx of ovarian cyst.

## 2022-04-08 NOTE — Discharge Instructions (Addendum)
If you develop worsening, continued, or recurrent abdominal pain, uncontrolled vomiting, fever, chest or back pain, or any other new/concerning symptoms then return to the ER for evaluation.  

## 2022-04-08 NOTE — ED Triage Notes (Signed)
Pt reports that for about 2 weeks she has had some discomfort like she was getting ready to start her period. She started her period a couple of hours ago, she has had cramping in her back and some in her abdominal, which is typical of her periods. She has not had more bleeding that her normal period. Hx of ovarian cyst on the left side.

## 2022-04-08 NOTE — ED Provider Notes (Signed)
[facility] EMERGENCY DEPARTMENT Provider Note   CSN: [id number] Arrival date & time: 04/08/22  [id number]     History  Chief Complaint  Patient presents with   Abdominal Pain    Kathryn Ferguson is a 36 y.o. female.  HPI 36 year old female presents with abdominal cramping and low back pain.  She started her menstrual cycle this morning.  Previous to this she has been dealing with some low back discomfort and lower abdominal pain.  Saw her OB/GYN and had a negative UA.  However this morning her menstrual cycle started and so the typical cramping she gets from that was way worse.  She took ibuprofen which typically works within an hour but it was not working.  She was also becoming lightheaded.  She is concerned because she states she has a history of a complex cyst on the left side and was told it would really go away.  Now she is feeling a lot better, states the ibuprofen finally kicked in but it was delayed from typical.  No vaginal discharge, urinary symptoms, vomiting, diarrhea.  She states that she is no longer lightheaded.  Home Medications Prior to Admission medications   Medication Sig Start Date End Date Taking? Authorizing Provider  ibuprofen (ADVIL,MOTRIN) 600 MG tablet Take 1 tablet (600 mg total) by mouth every 6 (six) hours. 10/23/15   Artelia Laroche, CNM  SUMAtriptan (IMITREX) 100 MG tablet Take 1 tablet earliest onset of headache.  May repeat after 2 hours.  Maximum 2 tablets in 24 hours. 01/01/22   Pieter Partridge, DO  tiZANidine (ZANAFLEX) 4 MG tablet Take 1 tablet (4 mg total) by mouth every 6 (six) hours as needed for muscle spasms. 01/01/22   Pieter Partridge, DO      Allergies    Cefaclor    Review of Systems   Review of Systems  Constitutional:  Negative for fever.  Gastrointestinal:  Positive for abdominal pain. Negative for diarrhea and vomiting.  Genitourinary:  Positive for vaginal bleeding. Negative for dysuria and vaginal discharge.  Musculoskeletal:   Positive for back pain.    Physical Exam Updated Vital Signs BP 105/71 (BP Location: Right Arm)   Pulse 72   Temp 97.9 F (36.6 C)   Resp 16   SpO2 100%  Physical Exam Vitals and nursing note reviewed.  Constitutional:      Appearance: She is well-developed.  HENT:     Head: Normocephalic and atraumatic.  Cardiovascular:     Rate and Rhythm: Normal rate and regular rhythm.     Heart sounds: Normal heart sounds.  Pulmonary:     Effort: Pulmonary effort is normal.     Breath sounds: Normal breath sounds.  Abdominal:     Palpations: Abdomen is soft.     Tenderness: There is no abdominal tenderness. There is no right CVA tenderness or left CVA tenderness.  Skin:    General: Skin is warm and dry.  Neurological:     Mental Status: She is alert.     ED Results / Procedures / Treatments   Labs (all labs ordered are listed, but only abnormal results are displayed) Labs Reviewed  COMPREHENSIVE METABOLIC PANEL - Abnormal; Notable for the following components:      Result Value   Glucose, Bld 104 (*)    Calcium 8.7 (*)    Total Protein 6.4 (*)    All other components within normal limits  CBC WITH DIFFERENTIAL/PLATELET - Abnormal; Notable for the following  components:   Hemoglobin 11.8 (*)    HCT 34.8 (*)    All other components within normal limits  URINALYSIS, ROUTINE W REFLEX MICROSCOPIC - Abnormal; Notable for the following components:   APPearance HAZY (*)    Hgb urine dipstick LARGE (*)    Protein, ur 30 (*)    RBC / HPF >50 (*)    Bacteria, UA RARE (*)    All other components within normal limits  LIPASE, BLOOD  I-STAT BETA HCG BLOOD, ED ([facility], [facility], [facility] ONLY)  TYPE AND SCREEN    EKG None  Radiology US PELVIC COMPLETE W TRANSVAGINAL AND TORSION R/O  Result Date: 04/08/2022 CLINICAL DATA:  36 year old female with history of pelvic pain. EXAM: TRANSABDOMINAL AND TRANSVAGINAL ULTRASOUND OF PELVIS DOPPLER ULTRASOUND OF OVARIES TECHNIQUE: Both transabdominal and  transvaginal ultrasound examinations of the pelvis were performed. Transabdominal technique was performed for global imaging of the pelvis including uterus, ovaries, adnexal regions, and pelvic cul-de-sac. It was necessary to proceed with endovaginal exam following the transabdominal exam to visualize the ovaries. Color and duplex Doppler ultrasound was utilized to evaluate blood flow to the ovaries. COMPARISON:  Pelvic ultrasound 07/14/2009. FINDINGS: Uterus Measurements: 7.1 x 4.0 x 5.7 cm = volume: 80.9 mL. No fibroids or other mass visualized. Endometrium Thickness: 12 mm.  No focal abnormality visualized. Right ovary Measurements: 4.5 x 2.4 x 2.3 cm = volume: 12.8 mL. Normal appearance/no adnexal mass. Left ovary Measurements: 7.0 x 1.4 x 1.3 cm = volume: 3.8 mL. Normal appearance/no adnexal mass. Pulsed Doppler evaluation of both ovaries demonstrates normal low-resistance arterial and venous waveforms. Other findings Trace volume of free fluid in the cul-de-sac. IMPRESSION: 1. Normal sonographic appearance of the uterus and ovaries. No findings to account for the patient's symptoms. 2. Trace volume of free fluid in the cul-de-sac, likely physiologic in this young female patient. Electronically Signed   By: Vinnie Langton M.D.   On: 04/08/2022 08:14    Procedures Procedures    Medications Ordered in ED Medications  lactated ringers bolus 1,000 mL (0 mLs Intravenous Stopped 04/08/22 0730)    ED Course/ Medical Decision Making/ A&P                           Medical Decision Making  Labs viewed/interpreted.  Has a mild anemia but this goes along with a menstruating female of her age.  It is slightly less than baseline.  The rest of her labs are pretty unremarkable.  She is not pregnant.  Urinalysis shows blood which goes along with the menstrual cycle but not consistent with UTI.  Ultrasound imaging viewed by myself as well and I do not see any obvious torsion.  No cyst was seen.  At this point  her abdominal exam is benign and she has no abdominal tenderness and the cramping is essentially minimal.  Unclear why she was worse this morning but I think she is stable for discharge.  I have a low suspicion that she needs a CT as I have significantly low suspicion for appendicitis, diverticulitis, etc.  At this point, her blood pressure has come up.  It was soft in the high 90s when she arrived though she also tells me that her blood pressure tends to run low.  She had been given fluids in triage.  Given return precautions but I think she is stable to follow-up with her GYN as an outpatient.  Final Clinical Impression(s) / ED Diagnoses Final diagnoses:  Abdominal cramping    Rx / DC Orders ED Discharge Orders     None         Pricilla Loveless, MD 04/08/22 1114

## 2022-05-07 NOTE — Progress Notes (Deleted)
NEUROLOGY FOLLOW UP OFFICE NOTE  Kathryn Ferguson [id number]  Assessment/Plan:   Migraine without aura, without status migrainosus, not intractable   Migraine prevention:  *** Migraine rescue:  *** Limit use of pain relievers to no more than 2 days out of week to prevent risk of rebound or medication-overuse headache. Keep headache diary Follow up ***        Subjective:  Kathryn Ferguson is a 36 year old female who follows up for migraines.  UPDATE: Tried non-pharmacologic management.  *** Intensity:  *** Duration:  *** Frequency:  *** Rescue protocol:  tizanidine 4mg  at earliest onset of neck tightness; sumatriptan 100mg  earliest onset of headache Current NSAIDS/analgesics:  ibuprofen 600mg  Current triptans:  sumatriptan 100mg  Current ergotamine:  none Current anti-emetic:  none Current muscle relaxants:  tizanidine 4mg  Current Antihypertensive medications:  none Current Antidepressant medications:  none Current Anticonvulsant medications:  none Current anti-CGRP:  none Current Vitamins/Herbal/Supplements:  none Current Antihistamines/Decongestants:  none Other therapy:  none Hormone/birth control:  none       Caffeine:  1 cup of coffee in AM, 1 cup coffee or tea in afternoon Alcohol:  little alcohol Smoker:  no Diet:  No soda.  Drinks a lot of water.  Does not skip meals Exercise:  not routine Depression:  no; Anxiety:  some Other pain:  ovarian cyst Sleep hygiene:  okay.  No less than 7 hours a night  HISTORY:  Onset:  Worse following birth of her youngest child in 2018 Location:  left sided - retroorbital radiating to back of head and sometimes back of neck (neck tightness often precedes onset of headache) Quality:  sometimes constant pain, other headaches are throbbing Intensity:  5/10 to 9/10.   Aura:  absent Prodrome:  absent Associated symptoms:  Mild photophobia and phonophobia.  She denies associated nausea, vomiting, visual disturbance,unilateral  numbness or weakness. Duration:  24-48 hours.  Often wakes up with them Frequency:  2 times a week on average (2-4 days a week), about 2 times a month cannot function Triggers:  Going to bed late (after 11 PM), out in sun all day, contact lenses, hormonal, caffeine withdrawal, stress; neck movements may aggravate it. Relieving factors:  ibuprofen (variable efficacy), sleep, ice packs Activity:  severe aggravates Frequency of ibuprofen - 2-4 days a week   Past NSAIDS/analgesics:  Tylenol, Excedrin Past abortive triptans:  none Past abortive ergotamine:  none Past muscle relaxants:  Flexeril Past anti-emetic:  none Past antihypertensive medications:  none Past antidepressant medications:  none Past anticonvulsant medications:  none Past anti-CGRP:  Nurtec (rescue - ineffective) Past vitamins/Herbal/Supplements:  magnesium, riboflavin, CoQ10 Past antihistamines/decongestants:  none Other past therapies:  none    Family history of headache:  no  PAST MEDICAL HISTORY: Past Medical History:  Diagnosis Date   Abnormal Pap smear    Dyspareunia 04/20/2009   Qualifier: Diagnosis of  By: , CMA, Carol     Gestational thrombocytopenia (HCC) 01/04/2012   H/O varicella    HPV (human papilloma virus) anogenital infection    Nonspecific abnormal finding in amniotic fluid    polyhydramnios   Normal labor 01/03/2012   Other specified fetal and placental problems affecting management of mother, unspecified as to episode of care    sch   Postpartum care following vaginal delivery (3/10) 07/28/2013   Postpartum care following vaginal delivery (8/16) 01/04/2012   Vaginal Pap smear, abnormal     MEDICATIONS: Current Outpatient Medications on File Prior to Visit  Medication Sig  Dispense Refill   ibuprofen (ADVIL,MOTRIN) 600 MG tablet Take 1 tablet (600 mg total) by mouth every 6 (six) hours. 30 tablet 0   SUMAtriptan (IMITREX) 100 MG tablet Take 1 tablet earliest onset of headache.  May repeat  after 2 hours.  Maximum 2 tablets in 24 hours. 10 tablet 5   tiZANidine (ZANAFLEX) 4 MG tablet Take 1 tablet (4 mg total) by mouth every 6 (six) hours as needed for muscle spasms. 30 tablet 5   No current facility-administered medications on file prior to visit.    ALLERGIES: Allergies  Allergen Reactions   Cefaclor Swelling    Joint swelling when 36 yrs old Has tolerated amoxicillin    FAMILY HISTORY: Family History  Problem Relation Age of Onset   Thyroid disease Mother    Nephrolithiasis Mother    Heart attack Maternal Aunt    Stroke Maternal Grandmother    Neuropathy Maternal Grandmother    Heart disease Maternal Grandmother    Dementia Maternal Grandfather    Nephrolithiasis Maternal Grandfather    Heart disease Maternal Grandfather    Cancer Paternal Grandfather        brain   Hemophilia Paternal Grandfather    Brain cancer Paternal Grandfather       Objective:  *** General: No acute distress.  Patient appears ***-groomed.   Head:  Normocephalic/atraumatic Eyes:  Fundi examined but not visualized Neck: supple, no paraspinal tenderness, full range of motion Heart:  Regular rate and rhythm Lungs:  Clear to auscultation bilaterally Back: No paraspinal tenderness Neurological Exam: alert and oriented to person, place, and time.  Speech fluent and not dysarthric, language intact.  CN II-XII intact. Bulk and tone normal, muscle strength 5/5 throughout.  Sensation to light touch intact.  Deep tendon reflexes 2+ throughout, toes downgoing.  Finger to nose testing intact.  Gait normal, Romberg negative.   Shon Millet, DO  CC: ***

## 2022-05-08 ENCOUNTER — Ambulatory Visit: Payer: No Typology Code available for payment source | Admitting: Neurology

## 2022-06-14 ENCOUNTER — Ambulatory Visit: Payer: No Typology Code available for payment source | Admitting: Neurology

## 2022-10-17 NOTE — Progress Notes (Deleted)
NEUROLOGY FOLLOW UP OFFICE NOTE  Kathryn Ferguson [id number]  Assessment/Plan:   Migraine without aura, without status migrainosus, not intractable   Migraine prevention:  *** Migraine rescue:  tizanidine 4mg  at earliest onset of neck tightness (cautioned for drowsiness), sumatriptan 100mg  earliest onset of headache *** Limit use of pain relievers to no more than 2 days out of week to prevent risk of rebound or medication-overuse headache. Keep headache diary Follow up ***     Subjective:  Kathryn Ferguson is a 37 year old female who follows up for migraines.  UPDATE: Last visit, she opted to try lifestyle modification *** Intensity:  *** Duration:  *** with tizanidine/sumatriptan *** Frequency:  ***  Rescue protocol:  tizanidine 4mg  earliest onset of neck tightness, sumatriptan 100mg  earliest onset of headache Frequency of abortive medication: *** Current NSAIDS/analgesics:  ibuprofen 600mg  Current triptans:  sumatriptan 100mg  Current ergotamine:  none Current anti-emetic:  none Current muscle relaxants:  tinzanidine 4mg  PRN Current Antihypertensive medications:  none Current Antidepressant medications:  none Current Anticonvulsant medications:  none Current anti-CGRP:  none Current Vitamins/Herbal/Supplements:  none Current Antihistamines/Decongestants:  none Other therapy:  none Hormone/birth control:  none       Caffeine:  1 cup of coffee in AM, 1 cup coffee or tea in afternoon Alcohol:  little alcohol Smoker:  no Diet:  No soda.  Drinks a lot of water.  Does not skip meals Exercise:  not routine Depression:  no; Anxiety:  some Other pain:  ovarian cyst Sleep hygiene:  okay.  No less than 7 hours a night  HISTORY:  Onset:  Worse following birth of her youngest child in 2018 Location:  left sided - retroorbital radiating to back of head and sometimes back of neck (neck tightness often precedes onset of headache) Quality:  sometimes constant pain, other headaches  are throbbing Intensity:  5/10 to 9/10.   Aura:  absent Prodrome:  absent Associated symptoms:  Mild photophobia and phonophobia.  She denies associated nausea, vomiting, visual disturbance,unilateral numbness or weakness. Duration:  24-48 hours.  Often wakes up with them Frequency:  2 times a week on average (2-4 days a week), about 2 times a month cannot function Triggers:  Going to bed late (after 11 PM), out in sun all day, contact lenses, hormonal, caffeine withdrawal, stress; neck movements may aggravate it. Relieving factors:  ibuprofen (variable efficacy), sleep, ice packs Activity:  severe aggravates Frequency of ibuprofen - 2-4 days a week   Past NSAIDS/analgesics:  Tylenol, Excedrin Past abortive triptans:  none Past abortive ergotamine:  none Past muscle relaxants:  Flexeril Past anti-emetic:  none Past antihypertensive medications:  none Past antidepressant medications:  none Past anticonvulsant medications:  none Past anti-CGRP:  Nurtec (rescue - ineffective) Past vitamins/Herbal/Supplements:  magnesium, riboflavin, CoQ10 Past antihistamines/decongestants:  none Other past therapies:  none    Family history of headache:  no  PAST MEDICAL HISTORY: Past Medical History:  Diagnosis Date   Abnormal Pap smear    Dyspareunia 04/20/2009   Qualifier: Diagnosis of  By: Denyse Amass, CMA, Carol     Gestational thrombocytopenia (HCC) 01/04/2012   H/O varicella    HPV (human papilloma virus) anogenital infection    Nonspecific abnormal finding in amniotic fluid    polyhydramnios   Normal labor 01/03/2012   Other specified fetal and placental problems affecting management of mother, unspecified as to episode of care    sch   Postpartum care following vaginal delivery (3/10) 07/28/2013   Postpartum care  following vaginal delivery (8/16) 01/04/2012   Vaginal Pap smear, abnormal     MEDICATIONS: Current Outpatient Medications on File Prior to Visit  Medication Sig Dispense Refill    ibuprofen (ADVIL,MOTRIN) 600 MG tablet Take 1 tablet (600 mg total) by mouth every 6 (six) hours. 30 tablet 0   SUMAtriptan (IMITREX) 100 MG tablet Take 1 tablet earliest onset of headache.  May repeat after 2 hours.  Maximum 2 tablets in 24 hours. 10 tablet 5   tiZANidine (ZANAFLEX) 4 MG tablet Take 1 tablet (4 mg total) by mouth every 6 (six) hours as needed for muscle spasms. 30 tablet 5   No current facility-administered medications on file prior to visit.    ALLERGIES: Allergies  Allergen Reactions   Cefaclor Swelling    Joint swelling when 37 yrs old Has tolerated amoxicillin    FAMILY HISTORY: Family History  Problem Relation Age of Onset   Thyroid disease Mother    Nephrolithiasis Mother    Heart attack Maternal Aunt    Stroke Maternal Grandmother    Neuropathy Maternal Grandmother    Heart disease Maternal Grandmother    Dementia Maternal Grandfather    Nephrolithiasis Maternal Grandfather    Heart disease Maternal Grandfather    Cancer Paternal Grandfather        brain   Hemophilia Paternal Grandfather    Brain cancer Paternal Grandfather       Objective:  *** General: No acute distress.  Patient appears ***-groomed.   Head:  Normocephalic/atraumatic Eyes:  Fundi examined but not visualized Neck: supple, no paraspinal tenderness, full range of motion Heart:  Regular rate and rhythm Lungs:  Clear to auscultation bilaterally Back: No paraspinal tenderness Neurological Exam: alert and oriented.  Speech fluent and not dysarthric, language intact.  CN II-XII intact. Bulk and tone normal, muscle strength 5/5 throughout.  Sensation to light touch intact.  Deep tendon reflexes 2+ throughout, toes downgoing.  Finger to nose testing intact.  Gait normal, Romberg negative.   Shon Millet, DO  CC: ***

## 2022-10-18 ENCOUNTER — Ambulatory Visit: Payer: No Typology Code available for payment source | Admitting: Neurology

## 2023-11-18 ENCOUNTER — Other Ambulatory Visit: Payer: Self-pay | Admitting: Family Medicine

## 2023-11-18 DIAGNOSIS — R0781 Pleurodynia: Secondary | ICD-10-CM

## 2023-11-19 ENCOUNTER — Ambulatory Visit
Admission: RE | Admit: 2023-11-19 | Discharge: 2023-11-19 | Disposition: A | Discharge status: Home / Self Care | Source: Ambulatory Visit | Attending: Family Medicine | Admitting: Family Medicine

## 2023-11-19 DIAGNOSIS — R0781 Pleurodynia: Secondary | ICD-10-CM

## 2023-11-26 ENCOUNTER — Other Ambulatory Visit: Payer: Self-pay | Admitting: Family Medicine

## 2023-11-26 DIAGNOSIS — R634 Abnormal weight loss: Secondary | ICD-10-CM

## 2023-11-29 ENCOUNTER — Ambulatory Visit
Admission: RE | Admit: 2023-11-29 | Discharge: 2023-11-29 | Disposition: A | Discharge status: Home / Self Care | Source: Ambulatory Visit | Attending: Family Medicine | Admitting: Family Medicine

## 2023-11-29 DIAGNOSIS — R634 Abnormal weight loss: Secondary | ICD-10-CM

## 2023-11-29 MED ORDER — IOPAMIDOL (ISOVUE-300) INJECTION 61%
100.0000 mL | Freq: Once | INTRAVENOUS | Status: AC | PRN
  Administered 2023-11-29: 100 mL via INTRAVENOUS

## 2023-12-03 ENCOUNTER — Other Ambulatory Visit: Payer: Self-pay | Admitting: Family Medicine

## 2023-12-03 DIAGNOSIS — R9389 Abnormal findings on diagnostic imaging of other specified body structures: Secondary | ICD-10-CM

## 2023-12-03 DIAGNOSIS — Q5128 Other doubling of uterus, other specified: Secondary | ICD-10-CM

## 2023-12-03 DIAGNOSIS — N289 Disorder of kidney and ureter, unspecified: Secondary | ICD-10-CM

## 2023-12-11 ENCOUNTER — Ambulatory Visit
Admission: RE | Admit: 2023-12-11 | Discharge: 2023-12-11 | Disposition: A | Discharge status: Home / Self Care | Source: Ambulatory Visit | Attending: Family Medicine

## 2023-12-11 ENCOUNTER — Inpatient Hospital Stay
Admission: RE | Admit: 2023-12-11 | Discharge: 2023-12-11 | Disposition: A | Discharge status: Home / Self Care | Source: Ambulatory Visit | Attending: Family Medicine

## 2023-12-11 DIAGNOSIS — Q5128 Other doubling of uterus, other specified: Secondary | ICD-10-CM

## 2023-12-11 DIAGNOSIS — R9389 Abnormal findings on diagnostic imaging of other specified body structures: Secondary | ICD-10-CM

## 2023-12-11 DIAGNOSIS — N289 Disorder of kidney and ureter, unspecified: Secondary | ICD-10-CM

## 2023-12-11 MED ORDER — GADOPICLENOL 0.5 MMOL/ML IV SOLN
6.0000 mL | Freq: Once | INTRAVENOUS | Status: AC | PRN
  Administered 2023-12-11: 6 mL via INTRAVENOUS
